# Patient Record
Sex: Male | Born: 1980 | Race: Black or African American | Hispanic: No | Marital: Married | Smoking: Former smoker
Health system: Southern US, Community
[De-identification: ages and names within clinical notes are randomized; demographics above are authoritative.]

## PROBLEM LIST (undated history)

## (undated) DIAGNOSIS — D649 Anemia, unspecified: Secondary | ICD-10-CM

## (undated) DIAGNOSIS — N10 Acute tubulo-interstitial nephritis: Secondary | ICD-10-CM

## (undated) DIAGNOSIS — F32A Depression, unspecified: Secondary | ICD-10-CM

## (undated) DIAGNOSIS — A419 Sepsis, unspecified organism: Secondary | ICD-10-CM

## (undated) DIAGNOSIS — F419 Anxiety disorder, unspecified: Secondary | ICD-10-CM

## (undated) DIAGNOSIS — I10 Essential (primary) hypertension: Secondary | ICD-10-CM

## (undated) DIAGNOSIS — Z22322 Carrier or suspected carrier of Methicillin resistant Staphylococcus aureus: Secondary | ICD-10-CM

## (undated) DIAGNOSIS — K219 Gastro-esophageal reflux disease without esophagitis: Secondary | ICD-10-CM

## (undated) HISTORY — DX: Depression, unspecified: F32.A

## (undated) HISTORY — DX: Carrier or suspected carrier of methicillin resistant Staphylococcus aureus: Z22.322

## (undated) HISTORY — DX: Anxiety disorder, unspecified: F41.9

---

## 2003-08-31 ENCOUNTER — Emergency Department (HOSPITAL_COMMUNITY): Admission: EM | Admit: 2003-08-31 | Discharge: 2003-08-31 | Payer: Self-pay | Admitting: Emergency Medicine

## 2003-08-31 ENCOUNTER — Emergency Department (HOSPITAL_COMMUNITY): Admission: EM | Admit: 2003-08-31 | Discharge: 2003-08-31 | Payer: Self-pay | Admitting: *Deleted

## 2003-09-04 ENCOUNTER — Emergency Department (HOSPITAL_COMMUNITY): Admission: EM | Admit: 2003-09-04 | Discharge: 2003-09-05 | Payer: Self-pay | Admitting: Emergency Medicine

## 2015-10-31 DIAGNOSIS — D649 Anemia, unspecified: Secondary | ICD-10-CM

## 2015-10-31 DIAGNOSIS — A419 Sepsis, unspecified organism: Secondary | ICD-10-CM

## 2015-10-31 HISTORY — DX: Sepsis, unspecified organism: A41.9

## 2015-10-31 HISTORY — DX: Anemia, unspecified: D64.9

## 2015-11-07 ENCOUNTER — Encounter (HOSPITAL_COMMUNITY)
Admission: EM | Disposition: A | Discharge status: Home / Self Care | Payer: Self-pay | Source: Home / Self Care | Attending: Internal Medicine

## 2015-11-07 ENCOUNTER — Encounter (HOSPITAL_COMMUNITY): Payer: Self-pay

## 2015-11-07 ENCOUNTER — Inpatient Hospital Stay (HOSPITAL_COMMUNITY): Payer: BC Managed Care – PPO | Admitting: Anesthesiology

## 2015-11-07 ENCOUNTER — Inpatient Hospital Stay (HOSPITAL_COMMUNITY): Payer: BC Managed Care – PPO

## 2015-11-07 ENCOUNTER — Emergency Department (HOSPITAL_COMMUNITY): Payer: BC Managed Care – PPO

## 2015-11-07 ENCOUNTER — Inpatient Hospital Stay (HOSPITAL_COMMUNITY)
Admission: EM | Admit: 2015-11-07 | Discharge: 2015-11-12 | DRG: 872 | Disposition: A | Discharge status: Home / Self Care | Payer: BC Managed Care – PPO | Attending: Internal Medicine | Admitting: Internal Medicine

## 2015-11-07 DIAGNOSIS — A419 Sepsis, unspecified organism: Secondary | ICD-10-CM | POA: Diagnosis present

## 2015-11-07 DIAGNOSIS — E876 Hypokalemia: Secondary | ICD-10-CM | POA: Insufficient documentation

## 2015-11-07 DIAGNOSIS — N132 Hydronephrosis with renal and ureteral calculous obstruction: Secondary | ICD-10-CM | POA: Diagnosis not present

## 2015-11-07 DIAGNOSIS — N201 Calculus of ureter: Secondary | ICD-10-CM | POA: Diagnosis not present

## 2015-11-07 DIAGNOSIS — L0291 Cutaneous abscess, unspecified: Secondary | ICD-10-CM

## 2015-11-07 DIAGNOSIS — E872 Acidosis, unspecified: Secondary | ICD-10-CM | POA: Insufficient documentation

## 2015-11-07 DIAGNOSIS — D649 Anemia, unspecified: Secondary | ICD-10-CM | POA: Diagnosis present

## 2015-11-07 DIAGNOSIS — R509 Fever, unspecified: Secondary | ICD-10-CM | POA: Diagnosis not present

## 2015-11-07 DIAGNOSIS — B9561 Methicillin susceptible Staphylococcus aureus infection as the cause of diseases classified elsewhere: Secondary | ICD-10-CM | POA: Diagnosis present

## 2015-11-07 DIAGNOSIS — I10 Essential (primary) hypertension: Secondary | ICD-10-CM | POA: Diagnosis present

## 2015-11-07 DIAGNOSIS — R000 Tachycardia, unspecified: Secondary | ICD-10-CM | POA: Diagnosis present

## 2015-11-07 DIAGNOSIS — Z87891 Personal history of nicotine dependence: Secondary | ICD-10-CM

## 2015-11-07 DIAGNOSIS — N202 Calculus of kidney with calculus of ureter: Secondary | ICD-10-CM | POA: Diagnosis present

## 2015-11-07 DIAGNOSIS — Z419 Encounter for procedure for purposes other than remedying health state, unspecified: Secondary | ICD-10-CM

## 2015-11-07 DIAGNOSIS — N10 Acute tubulo-interstitial nephritis: Secondary | ICD-10-CM | POA: Diagnosis present

## 2015-11-07 DIAGNOSIS — Z960 Presence of urogenital implants: Secondary | ICD-10-CM | POA: Diagnosis not present

## 2015-11-07 DIAGNOSIS — A4101 Sepsis due to Methicillin susceptible Staphylococcus aureus: Secondary | ICD-10-CM | POA: Diagnosis present

## 2015-11-07 DIAGNOSIS — R1031 Right lower quadrant pain: Secondary | ICD-10-CM | POA: Diagnosis present

## 2015-11-07 DIAGNOSIS — R7881 Bacteremia: Secondary | ICD-10-CM | POA: Diagnosis not present

## 2015-11-07 DIAGNOSIS — N139 Obstructive and reflux uropathy, unspecified: Secondary | ICD-10-CM | POA: Diagnosis present

## 2015-11-07 HISTORY — DX: Acute pyelonephritis: N10

## 2015-11-07 HISTORY — PX: CYSTOSCOPY W/ URETERAL STENT PLACEMENT: SHX1429

## 2015-11-07 LAB — URINALYSIS, ROUTINE W REFLEX MICROSCOPIC
Bilirubin Urine: NEGATIVE
GLUCOSE, UA: NEGATIVE mg/dL
KETONES UR: NEGATIVE mg/dL
NITRITE: NEGATIVE
PROTEIN: 30 mg/dL — AB
Specific Gravity, Urine: 1.026 (ref 1.005–1.030)
pH: 5.5 (ref 5.0–8.0)

## 2015-11-07 LAB — COMPREHENSIVE METABOLIC PANEL
ALK PHOS: 64 U/L (ref 38–126)
ALT: 21 U/L (ref 17–63)
AST: 28 U/L (ref 15–41)
Albumin: 4.3 g/dL (ref 3.5–5.0)
Anion gap: 10 (ref 5–15)
BILIRUBIN TOTAL: 0.8 mg/dL (ref 0.3–1.2)
BUN: 13 mg/dL (ref 6–20)
CALCIUM: 9.5 mg/dL (ref 8.9–10.3)
CHLORIDE: 106 mmol/L (ref 101–111)
CO2: 23 mmol/L (ref 22–32)
CREATININE: 0.79 mg/dL (ref 0.61–1.24)
Glucose, Bld: 141 mg/dL — ABNORMAL HIGH (ref 65–99)
Potassium: 3.5 mmol/L (ref 3.5–5.1)
Sodium: 139 mmol/L (ref 135–145)
TOTAL PROTEIN: 7.3 g/dL (ref 6.5–8.1)

## 2015-11-07 LAB — I-STAT CG4 LACTIC ACID, ED
LACTIC ACID, VENOUS: 1.24 mmol/L (ref 0.5–1.9)
Lactic Acid, Venous: 2.93 mmol/L (ref 0.5–1.9)

## 2015-11-07 LAB — CBC
HCT: 41.8 % (ref 39.0–52.0)
Hemoglobin: 14.8 g/dL (ref 13.0–17.0)
MCH: 32.3 pg (ref 26.0–34.0)
MCHC: 35.4 g/dL (ref 30.0–36.0)
MCV: 91.3 fL (ref 78.0–100.0)
PLATELETS: 307 10*3/uL (ref 150–400)
RBC: 4.58 MIL/uL (ref 4.22–5.81)
RDW: 12.2 % (ref 11.5–15.5)
WBC: 11.5 10*3/uL — AB (ref 4.0–10.5)

## 2015-11-07 LAB — LACTIC ACID, PLASMA: Lactic Acid, Venous: 1.2 mmol/L (ref 0.5–1.9)

## 2015-11-07 LAB — URINE MICROSCOPIC-ADD ON: Squamous Epithelial / LPF: NONE SEEN

## 2015-11-07 LAB — APTT: aPTT: 31 seconds (ref 24–37)

## 2015-11-07 LAB — LIPASE, BLOOD: LIPASE: 25 U/L (ref 11–51)

## 2015-11-07 LAB — PROCALCITONIN: Procalcitonin: 1.17 ng/mL

## 2015-11-07 LAB — PROTIME-INR
INR: 1.18 (ref 0.00–1.49)
PROTHROMBIN TIME: 15.2 s (ref 11.6–15.2)

## 2015-11-07 SURGERY — CYSTOSCOPY, WITH RETROGRADE PYELOGRAM AND URETERAL STENT INSERTION
Anesthesia: General | Site: Ureter | Laterality: Right

## 2015-11-07 MED ORDER — ROCURONIUM BROMIDE 100 MG/10ML IV SOLN
INTRAVENOUS | Status: DC | PRN
  Administered 2015-11-07: 20 mg via INTRAVENOUS

## 2015-11-07 MED ORDER — LIDOCAINE HCL 2 % EX GEL
CUTANEOUS | Status: AC
Start: 2015-11-07 — End: 2015-11-07
  Filled 2015-11-07: qty 20

## 2015-11-07 MED ORDER — SODIUM CHLORIDE 0.9% FLUSH
3.0000 mL | Freq: Two times a day (BID) | INTRAVENOUS | Status: DC
  Administered 2015-11-07 – 2015-11-11 (×7): 3 mL via INTRAVENOUS

## 2015-11-07 MED ORDER — MORPHINE SULFATE (PF) 4 MG/ML IV SOLN
4.0000 mg | Freq: Once | INTRAVENOUS | Status: AC
  Administered 2015-11-07: 4 mg via INTRAVENOUS
  Filled 2015-11-07: qty 1

## 2015-11-07 MED ORDER — IOPAMIDOL (ISOVUE-300) INJECTION 61%
INTRAVENOUS | Status: AC
  Administered 2015-11-07: 100 mL
  Filled 2015-11-07: qty 100

## 2015-11-07 MED ORDER — HYDROMORPHONE HCL 1 MG/ML IJ SOLN
1.0000 mg | INTRAMUSCULAR | Status: DC | PRN
  Administered 2015-11-08 (×2): 1 mg via INTRAVENOUS
  Filled 2015-11-07 (×2): qty 1

## 2015-11-07 MED ORDER — IBUPROFEN 100 MG/5ML PO SUSP
600.0000 mg | Freq: Once | ORAL | Status: DC
  Filled 2015-11-07: qty 30

## 2015-11-07 MED ORDER — IOPAMIDOL (ISOVUE-300) INJECTION 61%
INTRAVENOUS | Status: DC | PRN
  Administered 2015-11-07: .000001 mL via URETHRAL

## 2015-11-07 MED ORDER — PROPOFOL 10 MG/ML IV BOLUS
INTRAVENOUS | Status: DC | PRN
  Administered 2015-11-07: 200 mg via INTRAVENOUS

## 2015-11-07 MED ORDER — ACETAMINOPHEN 325 MG PO TABS
ORAL_TABLET | ORAL | Status: AC
  Filled 2015-11-07: qty 2

## 2015-11-07 MED ORDER — ONDANSETRON HCL 4 MG/2ML IJ SOLN
INTRAMUSCULAR | Status: AC
  Filled 2015-11-07: qty 2

## 2015-11-07 MED ORDER — IBUPROFEN 200 MG PO TABS
600.0000 mg | ORAL_TABLET | Freq: Once | ORAL | Status: AC
  Administered 2015-11-07: 600 mg via ORAL
  Filled 2015-11-07: qty 3

## 2015-11-07 MED ORDER — ONDANSETRON HCL 4 MG/2ML IJ SOLN: 4.0000 mg | Freq: Once | INTRAMUSCULAR | Status: DC | PRN

## 2015-11-07 MED ORDER — ACETAMINOPHEN 650 MG RE SUPP
650.0000 mg | Freq: Four times a day (QID) | RECTAL | Status: DC | PRN
  Administered 2015-11-08: 650 mg via RECTAL
  Filled 2015-11-07: qty 1

## 2015-11-07 MED ORDER — ONDANSETRON 4 MG PO TBDP
4.0000 mg | ORAL_TABLET | Freq: Once | ORAL | Status: AC
  Administered 2015-11-07: 4 mg via ORAL

## 2015-11-07 MED ORDER — SODIUM CHLORIDE 0.9 % IJ SOLN
INTRAMUSCULAR | Status: AC
  Filled 2015-11-07: qty 10

## 2015-11-07 MED ORDER — SODIUM CHLORIDE 0.9 % IV BOLUS (SEPSIS)
500.0000 mL | Freq: Once | INTRAVENOUS | Status: AC
  Administered 2015-11-07: 500 mL via INTRAVENOUS

## 2015-11-07 MED ORDER — SODIUM CHLORIDE 0.9 % IV BOLUS (SEPSIS)
1000.0000 mL | Freq: Once | INTRAVENOUS | Status: AC
  Administered 2015-11-07: 1000 mL via INTRAVENOUS

## 2015-11-07 MED ORDER — ACETAMINOPHEN 325 MG PO TABS
650.0000 mg | ORAL_TABLET | Freq: Once | ORAL | Status: AC
  Administered 2015-11-07: 650 mg via ORAL
  Filled 2015-11-07: qty 2

## 2015-11-07 MED ORDER — PIPERACILLIN-TAZOBACTAM 3.375 G IVPB
3.3750 g | Freq: Three times a day (TID) | INTRAVENOUS | Status: DC
Start: 2015-11-08 — End: 2015-11-08
  Administered 2015-11-08 (×2): 3.375 g via INTRAVENOUS
  Filled 2015-11-07 (×3): qty 50

## 2015-11-07 MED ORDER — ONDANSETRON HCL 4 MG PO TABS: 4.0000 mg | ORAL_TABLET | Freq: Four times a day (QID) | ORAL | Status: DC | PRN

## 2015-11-07 MED ORDER — IOPAMIDOL (ISOVUE-300) INJECTION 61%
INTRAVENOUS | Status: AC
  Filled 2015-11-07: qty 50

## 2015-11-07 MED ORDER — HYDROMORPHONE HCL 1 MG/ML IJ SOLN
1.0000 mg | Freq: Once | INTRAMUSCULAR | Status: AC
  Administered 2015-11-07: 1 mg via INTRAVENOUS
  Filled 2015-11-07: qty 1

## 2015-11-07 MED ORDER — ONDANSETRON HCL 4 MG/2ML IJ SOLN
4.0000 mg | Freq: Once | INTRAMUSCULAR | Status: AC
  Administered 2015-11-07: 4 mg via INTRAVENOUS
  Filled 2015-11-07: qty 2

## 2015-11-07 MED ORDER — SODIUM CHLORIDE 0.9 % IV SOLN
INTRAVENOUS | Status: DC
  Administered 2015-11-07 – 2015-11-08 (×2): via INTRAVENOUS
  Administered 2015-11-08: 1000 mL via INTRAVENOUS
  Administered 2015-11-08: 14:00:00 via INTRAVENOUS
  Administered 2015-11-09: 1000 mL via INTRAVENOUS
  Administered 2015-11-09 – 2015-11-12 (×8): via INTRAVENOUS

## 2015-11-07 MED ORDER — ONDANSETRON 4 MG PO TBDP
ORAL_TABLET | ORAL | Status: AC
  Filled 2015-11-07: qty 1

## 2015-11-07 MED ORDER — SODIUM CHLORIDE 0.9 % IR SOLN
Status: DC | PRN
  Administered 2015-11-07: 1000 mL

## 2015-11-07 MED ORDER — SUCCINYLCHOLINE CHLORIDE 20 MG/ML IJ SOLN
INTRAMUSCULAR | Status: DC | PRN
  Administered 2015-11-07: 140 mg via INTRAVENOUS

## 2015-11-07 MED ORDER — MIDAZOLAM HCL 2 MG/2ML IJ SOLN
INTRAMUSCULAR | Status: DC | PRN
  Administered 2015-11-07: 2 mg via INTRAVENOUS

## 2015-11-07 MED ORDER — PROPOFOL 10 MG/ML IV BOLUS
INTRAVENOUS | Status: AC
  Filled 2015-11-07: qty 40

## 2015-11-07 MED ORDER — ACETAMINOPHEN 10 MG/ML IV SOLN
INTRAVENOUS | Status: DC | PRN
  Administered 2015-11-07: 1000 mg via INTRAVENOUS

## 2015-11-07 MED ORDER — PIPERACILLIN-TAZOBACTAM 3.375 G IVPB 30 MIN
3.3750 g | Freq: Once | INTRAVENOUS | Status: AC
  Administered 2015-11-07: 3.375 g via INTRAVENOUS
  Filled 2015-11-07: qty 50

## 2015-11-07 MED ORDER — FENTANYL CITRATE (PF) 100 MCG/2ML IJ SOLN: 25.0000 ug | INTRAMUSCULAR | Status: DC | PRN

## 2015-11-07 MED ORDER — LACTATED RINGERS IV SOLN
INTRAVENOUS | Status: DC | PRN
  Administered 2015-11-07 (×2): via INTRAVENOUS

## 2015-11-07 MED ORDER — SUFENTANIL CITRATE 50 MCG/ML IV SOLN
INTRAVENOUS | Status: DC | PRN
  Administered 2015-11-07: 10 ug via INTRAVENOUS
  Administered 2015-11-07: 5 ug via INTRAVENOUS

## 2015-11-07 MED ORDER — SODIUM CHLORIDE 0.9 % IV BOLUS (SEPSIS): 1000.0000 mL | Freq: Once | INTRAVENOUS | Status: DC

## 2015-11-07 MED ORDER — SUGAMMADEX SODIUM 200 MG/2ML IV SOLN
INTRAVENOUS | Status: DC | PRN
  Administered 2015-11-07: 200 mg via INTRAVENOUS

## 2015-11-07 MED ORDER — ACETAMINOPHEN 325 MG PO TABS
650.0000 mg | ORAL_TABLET | Freq: Once | ORAL | Status: AC
  Administered 2015-11-07: 650 mg via ORAL

## 2015-11-07 MED ORDER — SUFENTANIL CITRATE 50 MCG/ML IV SOLN
INTRAVENOUS | Status: AC
  Filled 2015-11-07: qty 1

## 2015-11-07 MED ORDER — ONDANSETRON HCL 4 MG/2ML IJ SOLN
INTRAMUSCULAR | Status: DC | PRN
  Administered 2015-11-07: 4 mg via INTRAVENOUS

## 2015-11-07 MED ORDER — ACETAMINOPHEN 10 MG/ML IV SOLN
INTRAVENOUS | Status: AC
  Filled 2015-11-07: qty 100

## 2015-11-07 MED ORDER — MIDAZOLAM HCL 2 MG/2ML IJ SOLN
INTRAMUSCULAR | Status: AC
  Filled 2015-11-07: qty 2

## 2015-11-07 MED ORDER — LIDOCAINE HCL (CARDIAC) 20 MG/ML IV SOLN
INTRAVENOUS | Status: DC | PRN
  Administered 2015-11-07: 60 mg via INTRATRACHEAL

## 2015-11-07 MED ORDER — LIDOCAINE 2% (20 MG/ML) 5 ML SYRINGE
INTRAMUSCULAR | Status: AC
  Filled 2015-11-07: qty 5

## 2015-11-07 MED ORDER — SUGAMMADEX SODIUM 200 MG/2ML IV SOLN
INTRAVENOUS | Status: AC
  Filled 2015-11-07: qty 2

## 2015-11-07 MED ORDER — ONDANSETRON HCL 4 MG/2ML IJ SOLN
4.0000 mg | Freq: Four times a day (QID) | INTRAMUSCULAR | Status: DC | PRN
  Administered 2015-11-08 – 2015-11-11 (×6): 4 mg via INTRAVENOUS
  Filled 2015-11-07 (×6): qty 2

## 2015-11-07 MED ORDER — ACETAMINOPHEN 325 MG PO TABS
650.0000 mg | ORAL_TABLET | Freq: Four times a day (QID) | ORAL | Status: DC | PRN
  Administered 2015-11-08 – 2015-11-10 (×6): 650 mg via ORAL
  Filled 2015-11-07 (×6): qty 2

## 2015-11-07 MED ORDER — IBUPROFEN 100 MG/5ML PO SUSP
ORAL | Status: AC
  Filled 2015-11-07: qty 30

## 2015-11-07 MED ORDER — SUCCINYLCHOLINE CHLORIDE 200 MG/10ML IV SOSY
PREFILLED_SYRINGE | INTRAVENOUS | Status: AC
  Filled 2015-11-07: qty 10

## 2015-11-07 MED ORDER — IBUPROFEN 100 MG/5ML PO SUSP
ORAL | Status: AC
  Filled 2015-11-07: qty 20

## 2015-11-07 MED ORDER — ROCURONIUM BROMIDE 50 MG/5ML IV SOLN
INTRAVENOUS | Status: AC
  Filled 2015-11-07: qty 1

## 2015-11-07 SURGICAL SUPPLY — 32 items
ADAPTER CATH URET PLST 4-6FR (CATHETERS) ×3 IMPLANT
BAG URINE DRAINAGE (UROLOGICAL SUPPLIES) ×3 IMPLANT
BAG URO CATCHER STRL LF (MISCELLANEOUS) ×3 IMPLANT
BENZOIN TINCTURE PRP APPL 2/3 (GAUZE/BANDAGES/DRESSINGS) IMPLANT
BLADE 10 SAFETY STRL DISP (BLADE) ×3 IMPLANT
BUCKET BIOHAZARD WASTE 5 GAL (MISCELLANEOUS) ×3 IMPLANT
CATH FOLEY 2WAY SLVR  5CC 16FR (CATHETERS)
CATH FOLEY 2WAY SLVR 5CC 16FR (CATHETERS) IMPLANT
CATH INTERMIT  6FR 70CM (CATHETERS) ×3 IMPLANT
CATH URET 5FR 28IN CONE TIP (BALLOONS)
CATH URET 5FR 70CM CONE TIP (BALLOONS) IMPLANT
COVER SURGICAL LIGHT HANDLE (MISCELLANEOUS) IMPLANT
Contour soft percuflex material Ureteral Stent ×3 IMPLANT
DRAPE CAMERA CLOSED 9X96 (DRAPES) ×3 IMPLANT
GLOVE BIO SURGEON STRL SZ7.5 (GLOVE) ×3 IMPLANT
GOWN STRL REUS W/ TWL XL LVL3 (GOWN DISPOSABLE) ×2 IMPLANT
GOWN STRL REUS W/TWL XL LVL3 (GOWN DISPOSABLE) ×4
GUIDEWIRE ANG ZIPWIRE 038X150 (WIRE) IMPLANT
GUIDEWIRE COOK  .035 (WIRE) IMPLANT
GUIDEWIRE STR DUAL SENSOR (WIRE) ×3 IMPLANT
KIT ROOM TURNOVER OR (KITS) ×3 IMPLANT
NS IRRIG 1000ML POUR BTL (IV SOLUTION) ×3 IMPLANT
PACK CYSTOSCOPY (CUSTOM PROCEDURE TRAY) ×3 IMPLANT
PAD ARMBOARD 7.5X6 YLW CONV (MISCELLANEOUS) ×6 IMPLANT
PLUG CATH AND CAP STER (CATHETERS) IMPLANT
STENT CONTOUR 6FRX26X.035 (STENTS) ×6 IMPLANT
STENT URET 6FRX24 CONTOUR (STENTS) IMPLANT
SYRINGE CONTROL L 12CC (SYRINGE) ×3 IMPLANT
SYRINGE TOOMEY DISP (SYRINGE) IMPLANT
UNDERPAD 30X30 INCONTINENT (UNDERPADS AND DIAPERS) ×3 IMPLANT
WATER STERILE IRR 1000ML POUR (IV SOLUTION) ×3 IMPLANT
WIRE COONS/BENSON .038X145CM (WIRE) IMPLANT

## 2015-11-07 NOTE — Progress Notes (Signed)
Pharmacy Antibiotic Note  James Marshall is a 35 y.o. male admitted on 11/07/2015 with fever, vomiting, and chills.  Pharmacy has been consulted for zosyn dosing for probable intra abdominal infection. Renal function stable.  Plan: 1) Zosyn 3.375g IV q8 (4 hour infusion) 2) Follow renal function, cultures, LOT  Height: 5\' 10"  (177.8 cm) Weight: 175 lb (79.379 kg) IBW/kg (Calculated) : 73  Temp (24hrs), Avg:102 F (38.9 C), Min:101 F (38.3 C), Max:103 F (39.4 C)   Recent Labs Lab 11/07/15 1315 11/07/15 1323 11/07/15 1638  WBC 11.5*  --   --   CREATININE 0.79  --   --   LATICACIDVEN  --  1.24 2.93*    Estimated Creatinine Clearance: 131.8 mL/min (by C-G formula based on Cr of 0.79).    No Known Allergies  Antimicrobials this admission: 7/8 Zosyn >>  Dose adjustments this admission: n/a  Microbiology results: n/a  Thank you for allowing pharmacy to be a part of this patient's care.  Fredrik RiggerMarkle, Layan Zalenski Sue 11/07/2015 4:58 PM

## 2015-11-07 NOTE — ED Notes (Signed)
Brought back into triage for vomiting and chiils, vitals rechecked and zofran administered

## 2015-11-07 NOTE — Anesthesia Postprocedure Evaluation (Signed)
Anesthesia Post Note  Patient: James Marshall  Procedure(s) Performed: Procedure(s) (LRB): CYSTOSCOPY WITH RETROGRADE AND URETERAL STENT PLACEMENT (Right)  Patient location during evaluation: PACU Anesthesia Type: General Level of consciousness: awake, awake and alert and oriented Pain management: pain level controlled Vital Signs Assessment: post-procedure vital signs reviewed and stable Respiratory status: spontaneous breathing, nonlabored ventilation and respiratory function stable Cardiovascular status: blood pressure returned to baseline Anesthetic complications: no    Last Vitals:  Filed Vitals:   11/07/15 2223 11/07/15 2230  BP: 121/75 112/73  Pulse:  89  Temp: 36.9 C   Resp:  24    Last Pain:  Filed Vitals:   11/07/15 2236  PainSc: 7                  Cherril Hett COKER

## 2015-11-07 NOTE — ED Notes (Signed)
MD at bedside. 

## 2015-11-07 NOTE — H&P (Signed)
History and Physical    James Marshall:[id number] DOB: 1981/03/04 DOA: 11/07/2015  PCP: No primary care provider on file. The patient says that he does not have a PCP.  Patient coming from: Home.  Chief Complaint: RLQ abodminal pain with fever, nausea, and vomiting  HPI: James Marshall is a 35 y.o. gentleman with a history of prior pyelonephritis who presents to the ED tonight accompanied by multiple family members.  He describes the acute onset of RLQ pain around 6pm last night.  No specific triggers.  He subsequently developed fever, sweats, shaking chills, and nonbloody emesis.  He has noticed decreased urine output.  Urine is concentrated but he denies dysuria.  No known history of renal stones.  No syncope/LOC.  Advil did not alleviate his pain.  He presented to the ED for further evaluation and treatment.  ED Course: The patient is febrile to 103 with tachycardia.  He has a leukocytosis.  CT A/P shows an obstructing right sided ureteral calculus with hydronephrosis and changes in the right kidney parynchema that likely reflect acute pyelonephritis.  No abscess scene at this time.  CODE Sepsis initiated.  Blood and urine cultures are pending.  IV Zosyn initiated.  The patient received weight based NS fluid bolus per protocol.  Lactic acid level is elevated.  The patient is going to the OR emergently with Dr. Annabell Howells of Urology for ureteral stent.  Hospitalist asked to admit.  Review of Systems: As per HPI otherwise 10 point review of systems negative.    Past Medical History  Diagnosis Date  . Acute pyelonephritis     History reviewed. No pertinent past surgical history.  The patient denies any prior surgeries.   reports that he has quit smoking. He does not have any smokeless tobacco history on file. He reports that he drinks alcohol. He reports that he does not use illicit drugs.  The patient reports that currently "VAPES".  Stopped smoking cigarettes 2 years ago.  17 pack year  history.  Drinks EtOH 2-3 times per week; denies history of EtOH withdrawal.  No illicit drug use.  He is single.  He has an 29 yo daughter.  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  Allergies  Allergen Reactions  . Hydrocodone Nausea And Vomiting    Family History  Problem Relation Age of Onset  . Cervical cancer Mother   . Diabetes Paternal Grandfather   . Prostate cancer Paternal Grandfather   . Heart disease Maternal Grandfather      Prior to Admission medications   Not on File  NONE  Physical Exam: Filed Vitals:   11/07/15 1915 11/07/15 1921 11/07/15 1930 11/07/15 1945  BP: 133/81  142/83 155/89  Pulse: 103  115 112  Temp:  102.6 F (39.2 C)    TempSrc:  Oral    Resp: 18  18 24   Height:      Weight:      SpO2: 92%  99% 97%      Constitutional: Ill appearing, active chills, but he is not decompensating at this point  Filed Vitals:   11/07/15 1915 11/07/15 1921 11/07/15 1930 11/07/15 1945  BP: 133/81  142/83 155/89  Pulse: 103  115 112  Temp:  102.6 F (39.2 C)    TempSrc:  Oral    Resp: 18  18 24   Height:      Weight:      SpO2: 92%  99% 97%   Eyes: PERRL, lids and  conjunctivae normal ENMT: Mucous membranes are moist. Posterior pharynx clear of any exudate or lesions. Normal dentition.  Neck: normal appearance, supple, no masses Respiratory: clear to auscultation bilaterally, no wheezing, no crackles. Normal respiratory effort. No accessory muscle use.  Cardiovascular: Normal rate, regular rhythm, no murmurs / rubs / gallops. No extremity edema. 2+ pedal pulses. GI: abdomen is mildly distended but compressible.  Mild tenderness without significant guarding.  Bowel sounds are hypoactive.   Musculoskeletal:  No joint deformity in upper and lower extremities. Good ROM, no contractures. Normal muscle tone.  Skin: no rashes, warm and dry.  He is not clammy. Neurologic: CN 2-12 grossly intact. Sensation intact, Strength symmetric  bilaterally, 5/5  Psychiatric: Normal judgment and insight. Alert and oriented x 3. Normal mood.     Labs on Admission: I have personally reviewed following labs and imaging studies  CBC:  Recent Labs Lab 11/07/15 1315  WBC 11.5*  HGB 14.8  HCT 41.8  MCV 91.3  PLT 307   Basic Metabolic Panel:  Recent Labs Lab 11/07/15 1315  NA 139  K 3.5  CL 106  CO2 23  GLUCOSE 141*  BUN 13  CREATININE 0.79  CALCIUM 9.5   GFR: Estimated Creatinine Clearance: 131.8 mL/min (by C-G formula based on Cr of 0.79). Liver Function Tests:  Recent Labs Lab 11/07/15 1315  AST 28  ALT 21  ALKPHOS 64  BILITOT 0.8  PROT 7.3  ALBUMIN 4.3    Recent Labs Lab 11/07/15 1315  LIPASE 25   Urine analysis:    Component Value Date/Time   COLORURINE YELLOW 11/07/2015 1314   APPEARANCEUR CLOUDY* 11/07/2015 1314   LABSPEC 1.026 11/07/2015 1314   PHURINE 5.5 11/07/2015 1314   GLUCOSEU NEGATIVE 11/07/2015 1314   HGBUR MODERATE* 11/07/2015 1314   BILIRUBINUR NEGATIVE 11/07/2015 1314   KETONESUR NEGATIVE 11/07/2015 1314   PROTEINUR 30* 11/07/2015 1314   NITRITE NEGATIVE 11/07/2015 1314   LEUKOCYTESUR MODERATE* 11/07/2015 1314   Sepsis Labs:  Lactic acid level 1.24 --> 2.93  Radiological Exams on Admission: Ct Abdomen Pelvis W Contrast  11/07/2015  CLINICAL DATA:  Right lower quadrant pain for 2 days. Vomiting and diarrhea. EXAM: CT ABDOMEN AND PELVIS WITH CONTRAST TECHNIQUE: Multidetector CT imaging of the abdomen and pelvis was performed using the standard protocol following bolus administration of intravenous contrast. CONTRAST:  1 ISOVUE-300 IOPAMIDOL (ISOVUE-300) INJECTION 61% COMPARISON:  Multiple CT scans since Sep 01, 2003 FINDINGS: A few thin walled lung cysts are seen in the right lung base with no solid components. No suspicious nodules or masses. The lung bases are otherwise normal. No free air or free fluid. Bilateral renal stones are identified with the largest seen on the left  measuring 6.7 mm on series 5, image 91. There is very mild stranding adjacent to the right kidney in addition to moderate hydronephrosis, not seen previously. The right ureter is prominent in caliber along its length with a 3.5 mm stone in the distal right ureter just above the UVJ. There is mild thickening of the ureteral wall at the level of the right distal ureteral stone, consistent with inflammation. Additionally, there is an ill-defined region of low attenuation in the anterior right kidney as seen on series 2, image 31 with no definitive focal adjacent fat stranding. This low-attenuation measures 2.6 by 1.4 cm. A few tiny low-attenuation lesions in both kidneys are likely cysts but too small to characterize. No other suspicious renal abnormalities. There is hepatic steatosis. The liver, gallbladder, portal  vein, spleen, adrenal glands, and pancreas are within normal limits. The abdominal aorta is normal in appearance. No adenopathy. The stomach and small bowel are normal. The colon and visualized appendix are normal. The pelvis demonstrates a distal right ureteral stone as described above. No adenopathy or mass identified the pelvis. The prostate, seminal vesicles, and bladder are normal in appearance. The visualized bones are unremarkable. Delayed images demonstrate no filling defects in the upper renal collecting systems bilaterally. There is appropriate excretion into the right renal collecting system. IMPRESSION: 1. Obstructing stone in the distal right ureter measuring 3.5 mm with moderate hydronephrosis and mild perinephric stranding. The region of the ill defined low-attenuation in the anterior right kidney could represent developing focal pyelonephritis. No discrete abscess seen on this study. Recommend clinical correlation and follow-up. 2. Multiple other bilateral renal stones. Electronically Signed   By: Gerome Samavid  Williams III M.D   On: 11/07/2015 18:38    EKG:  Pending.  Assessment/Plan Principal Problem:   Sepsis (HCC) Active Problems:   Right ureteral stone   Obstructive uropathy   Lactic acidosis   Acute pyelonephritis   Fever   Sinus tachycardia (HCC)      Sepsis without shock secondary to acute pyelonephritis and infected, obstructing right ureteral stone.  No evidence of AKI at this time.  Going to OR tonight for right ureteral stent. --agree with management per sepsis protocol, Empiric IV zosyn for now. --IV fluid resuscitation, weight based bolus per protocol given in the ED, then NS at 125cc/hr --Blood and urine cultures pending --Repeat lactic acid x 2 to establish trend/look for resolutione --Check procalcitonin --EKG, chest xray, coags per sepsis protocol, all pending --Expect tachycardia to resolve with proper hydration and management of infection --Urology assistance greatly appreciated   DVT prophylaxis: SCDs for now, he is high risk for bleeding after urological intervention, re-evaluate if stable in 24 hours Code Status: FULL Family Communication: Father, sister, brother present in the ED at time of admission Disposition Plan: Expect he will be here at least two midnights Consults called: Urology, Dr. Annabell HowellsWrenn Admission status: Inpatient, Stepdown   TIME SPENT: 60 minutes   Jerene Bearsarter,Bellanie Matthew Harrison MD [facility] Pager [phone]  If 7PM-7AM, please contact night-coverage [email or web address] Password [email or web address]  11/07/2015, 8:14 PM

## 2015-11-07 NOTE — Discharge Instructions (Signed)

## 2015-11-07 NOTE — Transfer of Care (Signed)
Immediate Anesthesia Transfer of Care Note  Patient: James Marshall  Procedure(s) Performed: Procedure(s): CYSTOSCOPY WITH RETROGRADE AND URETERAL STENT PLACEMENT (Right)  Patient Location: PACU  Anesthesia Type:General  Level of Consciousness: awake, alert  and oriented  Airway & Oxygen Therapy: Patient Spontanous Breathing and Patient connected to face mask oxygen  Post-op Assessment: Report given to RN and Post -op Vital signs reviewed and stable  Post vital signs: Reviewed and stable  Last Vitals:  Filed Vitals:   11/07/15 1945 11/07/15 2125  BP: 155/89 116/69  Pulse: 112 110  Temp:  39.2 C  Resp: 24 20    Last Pain:  Filed Vitals:   11/07/15 2129  PainSc: 7          Complications: No apparent anesthesia complications

## 2015-11-07 NOTE — Brief Op Note (Signed)
11/07/2015  9:09 PM  PATIENT:  Kalin B Statz  35 y.o. male  PRE-OPERATIVE DIAGNOSIS:  retained ureteral stone  POST-OPERATIVE DIAGNOSIS:  retained ureteral stone  PROCEDURE:  Procedure(s): CYSTOSCOPY WITH RETROGRADE AND URETERAL STENT PLACEMENT (Right)  SURGEON:  Surgeon(s) and Role:    * Bjorn PippinJohn Keithen Capo, MD - Primary  PHYSICIAN ASSISTANT:   ASSISTANTS: none   ANESTHESIA:   general  EBL:  Total I/O In: 1500 [I.V.:1500] Out: -   BLOOD ADMINISTERED:none  DRAINS: 6 x 26 JJ stent , right.   LOCAL MEDICATIONS USED:  NONE  SPECIMEN:  No Specimen  DISPOSITION OF SPECIMEN:  N/A  COUNTS:  YES  TOURNIQUET:  * No tourniquets in log *  DICTATION: .Other Dictation: Dictation Number [id number]  PLAN OF CARE: Admit for overnight observation  PATIENT DISPOSITION:  PACU - hemodynamically stable.   Delay start of Pharmacological VTE agent (>24hrs) due to surgical blood loss or risk of bleeding: not applicable

## 2015-11-07 NOTE — Anesthesia Procedure Notes (Signed)
Procedure Name: Intubation Date/Time: 11/07/2015 8:50 PM Performed by: Javius Sylla S Pre-anesthesia Checklist: Patient identified, Emergency Drugs available, Suction available, Patient being monitored and Timeout performed Patient Re-evaluated:Patient Re-evaluated prior to inductionOxygen Delivery Method: Circle system utilized Preoxygenation: Pre-oxygenation with 100% oxygen Intubation Type: IV induction, Rapid sequence and Cricoid Pressure applied Ventilation: Mask ventilation without difficulty Laryngoscope Size: Mac and 4 Grade View: Grade II Tube type: Oral Tube size: 8.0 mm Number of attempts: 1 Airway Equipment and Method: Stylet Secured at: 22 cm Tube secured with: Tape Dental Injury: Teeth and Oropharynx as per pre-operative assessment

## 2015-11-07 NOTE — ED Provider Notes (Signed)
CSN: [id number]     Arrival date & time 11/07/15  1303 History   First MD Initiated Contact with Patient 11/07/15 1600     Chief Complaint  Patient presents with  . RLQ pain      (Consider location/radiation/quality/duration/timing/severity/associated sxs/prior Treatment) HPI  35 year old male presents with right-sided abdominal pain. Started yesterday and then seemed to go away but then came back around 4 AM. Pain is sharp in his right lower quadrant. Has had multiple episodes of vomiting, which he describes as brown in color. One loose stool yesterday but otherwise no diarrhea. His urine has been a little dark but he denies dysuria or hematuria. Pain is constant. No prior abdominal surgeries. Patient was given Tylenol in the waiting room but he vomited it back up. He states his nausea is a little bit better after ODT Zofran.  History reviewed. No pertinent past medical history. History reviewed. No pertinent past surgical history. No family history on file. Social History  Substance Use Topics  . Smoking status: Never Smoker   . Smokeless tobacco: None  . Alcohol Use: Yes    Review of Systems  Constitutional: Positive for fever.  Gastrointestinal: Positive for nausea, vomiting, abdominal pain and diarrhea (one loose stool).  Genitourinary: Negative for dysuria and hematuria.  All other systems reviewed and are negative.     Allergies  Review of patient's allergies indicates no known allergies.  Home Medications   Prior to Admission medications   Not on File   BP 122/81 mmHg  Pulse 112  Temp(Src) 101.9 F (38.8 C) (Oral)  Resp 20  Ht 5\' 10"  (1.778 m)  Wt 175 lb (79.379 kg)  BMI 25.11 kg/m2  SpO2 99% Physical Exam  Constitutional: He is oriented to person, place, and time. He appears well-developed and well-nourished.  HENT:  Head: Normocephalic and atraumatic.  Right Ear: External ear normal.  Left Ear: External ear normal.  Nose: Nose normal.  Eyes: Right eye  exhibits no discharge. Left eye exhibits no discharge.  Neck: Neck supple.  Cardiovascular: Normal rate, regular rhythm, normal heart sounds and intact distal pulses.   Pulmonary/Chest: Effort normal and breath sounds normal.  Abdominal: Soft. There is tenderness (RLQ > RUQ pain) in the right upper quadrant and right lower quadrant. There is no rigidity, no rebound and no guarding.  Neurological: He is alert and oriented to person, place, and time.  Skin: Skin is warm and dry.  Nursing note and vitals reviewed.   ED Course  Procedures (including critical care time) Labs Review Labs Reviewed  COMPREHENSIVE METABOLIC PANEL - Abnormal; Notable for the following:    Glucose, Bld 141 (*)    All other components within normal limits  CBC - Abnormal; Notable for the following:    WBC 11.5 (*)    All other components within normal limits  URINALYSIS, ROUTINE W REFLEX MICROSCOPIC (NOT AT [facility]) - Abnormal; Notable for the following:    APPearance CLOUDY (*)    Hgb urine dipstick MODERATE (*)    Protein, ur 30 (*)    Leukocytes, UA MODERATE (*)    All other components within normal limits  URINE MICROSCOPIC-ADD ON - Abnormal; Notable for the following:    Bacteria, UA FEW (*)    All other components within normal limits  I-STAT CG4 LACTIC ACID, ED - Abnormal; Notable for the following:    Lactic Acid, Venous 2.93 (*)    All other components within normal limits  CULTURE, BLOOD (  ROUTINE X 2)  CULTURE, BLOOD (ROUTINE X 2)  URINE CULTURE  MRSA PCR SCREENING  LIPASE, BLOOD  LACTIC ACID, PLASMA  PROCALCITONIN  PROTIME-INR  APTT  LACTIC ACID, PLASMA  CBC  BASIC METABOLIC PANEL  I-STAT CG4 LACTIC ACID, ED    Imaging Review Ct Abdomen Pelvis W Contrast  11/07/2015  CLINICAL DATA:  Right lower quadrant pain for 2 days. Vomiting and diarrhea. EXAM: CT ABDOMEN AND PELVIS WITH CONTRAST TECHNIQUE: Multidetector CT imaging of the abdomen and pelvis was performed using the standard protocol  following bolus administration of intravenous contrast. CONTRAST:  1 ISOVUE-300 IOPAMIDOL (ISOVUE-300) INJECTION 61% COMPARISON:  Multiple CT scans since Sep 01, 2003 FINDINGS: A few thin walled lung cysts are seen in the right lung base with no solid components. No suspicious nodules or masses. The lung bases are otherwise normal. No free air or free fluid. Bilateral renal stones are identified with the largest seen on the left measuring 6.7 mm on series 5, image 91. There is very mild stranding adjacent to the right kidney in addition to moderate hydronephrosis, not seen previously. The right ureter is prominent in caliber along its length with a 3.5 mm stone in the distal right ureter just above the UVJ. There is mild thickening of the ureteral wall at the level of the right distal ureteral stone, consistent with inflammation. Additionally, there is an ill-defined region of low attenuation in the anterior right kidney as seen on series 2, image 31 with no definitive focal adjacent fat stranding. This low-attenuation measures 2.6 by 1.4 cm. A few tiny low-attenuation lesions in both kidneys are likely cysts but too small to characterize. No other suspicious renal abnormalities. There is hepatic steatosis. The liver, gallbladder, portal vein, spleen, adrenal glands, and pancreas are within normal limits. The abdominal aorta is normal in appearance. No adenopathy. The stomach and small bowel are normal. The colon and visualized appendix are normal. The pelvis demonstrates a distal right ureteral stone as described above. No adenopathy or mass identified the pelvis. The prostate, seminal vesicles, and bladder are normal in appearance. The visualized bones are unremarkable. Delayed images demonstrate no filling defects in the upper renal collecting systems bilaterally. There is appropriate excretion into the right renal collecting system. IMPRESSION: 1. Obstructing stone in the distal right ureter measuring 3.5 mm with  moderate hydronephrosis and mild perinephric stranding. The region of the ill defined low-attenuation in the anterior right kidney could represent developing focal pyelonephritis. No discrete abscess seen on this study. Recommend clinical correlation and follow-up. 2. Multiple other bilateral renal stones. Electronically Signed   By: Gerome Sam III M.D   On: 11/07/2015 18:38   Dg Cystogram  11/07/2015  CLINICAL DATA:  Acute pyelonephritis EXAM: CYSTOGRAM TECHNIQUE: Cystoscopy was performed by the urologist. Spot intraoperative fluoroscopic images were provided. FLUOROSCOPY TIME:  9 seconds COMPARISON:  CT abdomen pelvis dated 11/07/2015 FINDINGS: Frontal and lateral spot fluoroscopic images have been provided during the procedure. Contrast is present in the bladder. A right ureteral stent is incompletely visualized. IMPRESSION: Intraoperative fluoroscopic images during cystoscopy, as described above. Electronically Signed   By: Charline Bills M.D.   On: 11/07/2015 21:35   Dg Chest Port 1 View  11/07/2015  CLINICAL DATA:  Acute onset of sepsis.  Initial encounter. EXAM: PORTABLE CHEST 1 VIEW COMPARISON:  Thoracic spine radiographs performed 03/21/2009 FINDINGS: The lungs are well-aerated. Mild vascular congestion is suggested. There is no evidence of focal opacification, pleural effusion or pneumothorax. The cardiomediastinal  silhouette is borderline normal in size. No acute osseous abnormalities are seen. IMPRESSION: Mild vascular congestion suggested.  Lungs remain grossly clear. Electronically Signed   By: Roanna Raider M.D.   On: 11/07/2015 22:47   I have personally reviewed and evaluated these images and lab results as part of my medical decision-making.   EKG Interpretation None      CRITICAL CARE Performed by: Pricilla Loveless T   Total critical care time: 35 minutes  Critical care time was exclusive of separately billable procedures and treating other patients.  Critical care was  necessary to treat or prevent imminent or life-threatening deterioration.  Critical care was time spent personally by me on the following activities: development of treatment plan with patient and/or surrogate as well as nursing, discussions with consultants, evaluation of patient's response to treatment, examination of patient, obtaining history from patient or surrogate, ordering and performing treatments and interventions, ordering and review of laboratory studies, ordering and review of radiographic studies, pulse oximetry and re-evaluation of patient's condition.  MDM   Final diagnoses:  Sepsis (HCC)  Ureteral Stone  Patient's workup shows that his fever and pain are coming from a right ureteral stone that appears to be infected. Nausea was controlled and he was given IV morphine and Dilaudid. Appendix appears normal. Discussed with urology, Dr. Annabell Howells, who will, take patient to the OR for cystoscopy and stent placement. Hospitalist, Dr. [name], consulted for admission. Patient does have signs/symptoms of sepsis and his initial lactate was negative but second lactate has increased to over 2. However he is not hypotensive. Continue to IV hydrate and give IV antibiotics.    Pricilla Loveless, MD 11/07/15 [phone]

## 2015-11-07 NOTE — Anesthesia Preprocedure Evaluation (Addendum)
Anesthesia Evaluation  Patient identified by MRN, date of birth, ID band Patient awake    Reviewed: Allergy & Precautions, NPO status , Patient's Chart, lab work & pertinent test results  Airway Mallampati: II  TM Distance: >3 FB Neck ROM: Full    Dental  (+) Teeth Intact, Dental Advisory Given   Pulmonary former smoker,    breath sounds clear to auscultation       Cardiovascular  Rhythm:Regular Rate:Normal     Neuro/Psych    GI/Hepatic   Endo/Other    Renal/GU      Musculoskeletal   Abdominal   Peds  Hematology   Anesthesia Other Findings   Reproductive/Obstetrics                            Anesthesia Physical Anesthesia Plan  ASA: III and emergent  Anesthesia Plan: General   Post-op Pain Management:    Induction: Intravenous  Airway Management Planned: Oral ETT  Additional Equipment:   Intra-op Plan:   Post-operative Plan: Extubation in OR  Informed Consent: I have reviewed the patients History and Physical, chart, labs and discussed the procedure including the risks, benefits and alternatives for the proposed anesthesia with the patient or authorized representative who has indicated his/her understanding and acceptance.   Dental advisory given  Plan Discussed with: CRNA and Anesthesiologist  Anesthesia Plan Comments:        Anesthesia Quick Evaluation

## 2015-11-07 NOTE — H&P (Signed)
Subjective: CC: Right flank pain.  Hx: James Marshall is a 35 yo male who I asked to see in consultation by Dr. Criss Alvine for a 3.23mm right distal stone with sepsis.   He had the onset yesterday at 6pm of right flank pain and dark urine.  The pain was severe with nausea and vomiting.   He has an elevated lactate in the ER today.   His WBC is 11.5K and his urine is dirty.   He has had a prior kidney infection and it looks like it was in 2005.   He has no other GU history.   ROS:  Review of Systems  Constitutional: Positive for fever and chills.  Gastrointestinal: Positive for nausea, vomiting and abdominal pain.  Genitourinary: Positive for dysuria and flank pain.  All other systems reviewed and are negative.   No Known Allergies  History reviewed. No pertinent past medical history.  History reviewed. No pertinent past surgical history.  Social History   Social History  . Marital Status: Single    Spouse Name: N/A  . Number of Children: N/A  . Years of Education: N/A   Occupational History  . Not on file.   Social History Main Topics  . Smoking status: Never Smoker   . Smokeless tobacco: Not on file  . Alcohol Use: Yes  . Drug Use: Not on file  . Sexual Activity: Not on file   Other Topics Concern  . Not on file   Social History Narrative  . No narrative on file    No family history on file.  Anti-infectives: Anti-infectives    Start     Dose/Rate Route Frequency Ordered Stop   11/08/15 0100  piperacillin-tazobactam (ZOSYN) IVPB 3.375 g     3.375 g 12.5 mL/hr over 240 Minutes Intravenous Every 8 hours 11/07/15 1657     11/07/15 1700  piperacillin-tazobactam (ZOSYN) IVPB 3.375 g     3.375 g 100 mL/hr over 30 Minutes Intravenous  Once 11/07/15 1646 11/07/15 1757      Current Facility-Administered Medications  Medication Dose Route Frequency Provider Last Rate Last Dose  . [START ON 11/08/2015] piperacillin-tazobactam (ZOSYN) IVPB 3.375 g  3.375 g Intravenous Q8H  Emi Holes, [facility]       No current outpatient prescriptions on file.     Objective: Vital signs in last 24 hours: Temp:  [101 F (38.3 C)-103 F (39.4 C)] 102.6 F (39.2 C) (07/08 1921) Pulse Rate:  [97-114] 103 (07/08 1845) Resp:  [14-22] 19 (07/08 1845) BP: (112-132)/(68-82) 126/76 mmHg (07/08 1845) SpO2:  [92 %-99 %] 95 % (07/08 1845) Weight:  [79.379 kg (175 lb)] 79.379 kg (175 lb) (07/08 1308)  Intake/Output from previous day:   Intake/Output this shift: Total I/O In: 1500 [I.V.:1500] Out: -    Physical Exam  Constitutional: He is oriented to person, place, and time and well-developed, well-nourished, and in no distress.  Having a rigor  HENT:  Head: Normocephalic and atraumatic.  Neck: Normal range of motion. Neck supple. No thyromegaly present.  Cardiovascular:  Sinus tach with no murmur.  Pulmonary/Chest: Effort normal and breath sounds normal. No respiratory distress.  Abdominal: Soft. He exhibits no mass. There is tenderness (RUQ and RCVA). There is guarding.  Musculoskeletal: Normal range of motion. He exhibits no edema or tenderness.  Lymphadenopathy:    He has no cervical adenopathy.  Neurological: He is alert and oriented to person, place, and time.  Skin:  Hot and flushed.  Psychiatric: Mood and  affect normal.  Vitals reviewed.   Lab Results:   Recent Labs  11/07/15 1315  WBC 11.5*  HGB 14.8  HCT 41.8  PLT 307   BMET  Recent Labs  11/07/15 1315  NA 139  K 3.5  CL 106  CO2 23  GLUCOSE 141*  BUN 13  CREATININE 0.79  CALCIUM 9.5   PT/INR No results for input(s): LABPROT, INR in the last 72 hours. ABG No results for input(s): PHART, HCO3 in the last 72 hours.  Invalid input(s): PCO2, PO2  Studies/Results: Ct Abdomen Pelvis W Contrast  11/07/2015  CLINICAL DATA:  Right lower quadrant pain for 2 days. Vomiting and diarrhea. EXAM: CT ABDOMEN AND PELVIS WITH CONTRAST TECHNIQUE: Multidetector CT imaging of the abdomen and pelvis  was performed using the standard protocol following bolus administration of intravenous contrast. CONTRAST:  1 ISOVUE-300 IOPAMIDOL (ISOVUE-300) INJECTION 61% COMPARISON:  Multiple CT scans since Sep 01, 2003 FINDINGS: A few thin walled lung cysts are seen in the right lung base with no solid components. No suspicious nodules or masses. The lung bases are otherwise normal. No free air or free fluid. Bilateral renal stones are identified with the largest seen on the left measuring 6.7 mm on series 5, image 91. There is very mild stranding adjacent to the right kidney in addition to moderate hydronephrosis, not seen previously. The right ureter is prominent in caliber along its length with a 3.5 mm stone in the distal right ureter just above the UVJ. There is mild thickening of the ureteral wall at the level of the right distal ureteral stone, consistent with inflammation. Additionally, there is an ill-defined region of low attenuation in the anterior right kidney as seen on series 2, image 31 with no definitive focal adjacent fat stranding. This low-attenuation measures 2.6 by 1.4 cm. A few tiny low-attenuation lesions in both kidneys are likely cysts but too small to characterize. No other suspicious renal abnormalities. There is hepatic steatosis. The liver, gallbladder, portal vein, spleen, adrenal glands, and pancreas are within normal limits. The abdominal aorta is normal in appearance. No adenopathy. The stomach and small bowel are normal. The colon and visualized appendix are normal. The pelvis demonstrates a distal right ureteral stone as described above. No adenopathy or mass identified the pelvis. The prostate, seminal vesicles, and bladder are normal in appearance. The visualized bones are unremarkable. Delayed images demonstrate no filling defects in the upper renal collecting systems bilaterally. There is appropriate excretion into the right renal collecting system. IMPRESSION: 1. Obstructing stone in the  distal right ureter measuring 3.5 mm with moderate hydronephrosis and mild perinephric stranding. The region of the ill defined low-attenuation in the anterior right kidney could represent developing focal pyelonephritis. No discrete abscess seen on this study. Recommend clinical correlation and follow-up. 2. Multiple other bilateral renal stones. Electronically Signed   By: Gerome Samavid  Williams III M.D   On: 11/07/2015 18:38   I have discussed the case with Dr. Criss AlvineGoldston and reviewed the CT films and report and labs.   Assessment: Obstructing right distal stone with sepsis of a urinary origin.  He was given Zosyn prior to cultures.  I will take him to the OR for cystoscopy and right ureteral stenting and reviewed the risks in detail as noted in the consent attestation.     He is aware he will need a second procedure for stone removal.    He will be admitted to medicine.     CC: Dr. Pricilla LovelessScott Goldston.  Opal Dinning J 11/07/2015 [id number]

## 2015-11-07 NOTE — ED Notes (Signed)
Patient complains of RLQ pain described as sharp x 2 days. Vomiting and diarrhea with same.

## 2015-11-07 NOTE — ED Notes (Signed)
Could not override ibuprofen tab, called pharmacy, will give report to OR

## 2015-11-08 DIAGNOSIS — N201 Calculus of ureter: Secondary | ICD-10-CM

## 2015-11-08 DIAGNOSIS — N132 Hydronephrosis with renal and ureteral calculous obstruction: Secondary | ICD-10-CM

## 2015-11-08 DIAGNOSIS — Z9889 Other specified postprocedural states: Secondary | ICD-10-CM

## 2015-11-08 DIAGNOSIS — R7881 Bacteremia: Secondary | ICD-10-CM

## 2015-11-08 DIAGNOSIS — Z960 Presence of urogenital implants: Secondary | ICD-10-CM

## 2015-11-08 DIAGNOSIS — D649 Anemia, unspecified: Secondary | ICD-10-CM | POA: Diagnosis present

## 2015-11-08 DIAGNOSIS — B9561 Methicillin susceptible Staphylococcus aureus infection as the cause of diseases classified elsewhere: Secondary | ICD-10-CM

## 2015-11-08 DIAGNOSIS — N10 Acute tubulo-interstitial nephritis: Secondary | ICD-10-CM

## 2015-11-08 LAB — CBC
HCT: 34.4 % — ABNORMAL LOW (ref 39.0–52.0)
HEMATOCRIT: 42.8 % (ref 39.0–52.0)
HEMOGLOBIN: 11.6 g/dL — AB (ref 13.0–17.0)
Hemoglobin: 14.7 g/dL (ref 13.0–17.0)
MCH: 31.7 pg (ref 26.0–34.0)
MCH: 32.1 pg (ref 26.0–34.0)
MCHC: 33.7 g/dL (ref 30.0–36.0)
MCHC: 34.3 g/dL (ref 30.0–36.0)
MCV: 94 fL (ref 78.0–100.0)
MCV: 93.4 fL (ref 78.0–100.0)
Platelets: 193 10*3/uL (ref 150–400)
Platelets: 174 10*3/uL (ref 150–400)
RBC: 3.66 MIL/uL — AB (ref 4.22–5.81)
RBC: 4.58 MIL/uL (ref 4.22–5.81)
RDW: 12.6 % (ref 11.5–15.5)
RDW: 12.4 % (ref 11.5–15.5)
WBC: 21.3 10*3/uL — ABNORMAL HIGH (ref 4.0–10.5)
WBC: 18.5 10*3/uL — AB (ref 4.0–10.5)

## 2015-11-08 LAB — BLOOD CULTURE ID PANEL (REFLEXED)
ACINETOBACTER BAUMANNII: NOT DETECTED
CARBAPENEM RESISTANCE: NOT DETECTED
Candida albicans: NOT DETECTED
Candida glabrata: NOT DETECTED
Candida krusei: NOT DETECTED
Candida parapsilosis: NOT DETECTED
Candida tropicalis: NOT DETECTED
ENTEROBACTERIACEAE SPECIES: NOT DETECTED
ENTEROCOCCUS SPECIES: NOT DETECTED
Enterobacter cloacae complex: NOT DETECTED
Escherichia coli: NOT DETECTED
Haemophilus influenzae: NOT DETECTED
Klebsiella oxytoca: NOT DETECTED
Klebsiella pneumoniae: NOT DETECTED
Listeria monocytogenes: NOT DETECTED
Methicillin resistance: NOT DETECTED
NEISSERIA MENINGITIDIS: NOT DETECTED
PSEUDOMONAS AERUGINOSA: NOT DETECTED
Proteus species: NOT DETECTED
STAPHYLOCOCCUS AUREUS BCID: DETECTED — AB
STAPHYLOCOCCUS SPECIES: DETECTED — AB
STREPTOCOCCUS AGALACTIAE: NOT DETECTED
STREPTOCOCCUS SPECIES: NOT DETECTED
Serratia marcescens: NOT DETECTED
Streptococcus pneumoniae: NOT DETECTED
Streptococcus pyogenes: NOT DETECTED
VANCOMYCIN RESISTANCE: NOT DETECTED

## 2015-11-08 LAB — BASIC METABOLIC PANEL
ANION GAP: 11 (ref 5–15)
Anion gap: 4 — ABNORMAL LOW (ref 5–15)
BUN: 12 mg/dL (ref 6–20)
BUN: 9 mg/dL (ref 6–20)
CHLORIDE: 107 mmol/L (ref 101–111)
CHLORIDE: 101 mmol/L (ref 101–111)
CO2: 27 mmol/L (ref 22–32)
CO2: 21 mmol/L — AB (ref 22–32)
CREATININE: 0.87 mg/dL (ref 0.61–1.24)
Calcium: 8.4 mg/dL — ABNORMAL LOW (ref 8.9–10.3)
Calcium: 8.4 mg/dL — ABNORMAL LOW (ref 8.9–10.3)
Creatinine, Ser: 0.89 mg/dL (ref 0.61–1.24)
GFR calc Af Amer: >60 mL/min (ref >60)
GFR calc non Af Amer: >60 mL/min (ref >60)
GLUCOSE: 112 mg/dL — AB (ref 65–99)
Glucose, Bld: 111 mg/dL — ABNORMAL HIGH (ref 65–99)
POTASSIUM: 3.5 mmol/L (ref 3.5–5.1)
POTASSIUM: 3.4 mmol/L — AB (ref 3.5–5.1)
SODIUM: 138 mmol/L (ref 135–145)
Sodium: 133 mmol/L — ABNORMAL LOW (ref 135–145)

## 2015-11-08 LAB — LACTIC ACID, PLASMA
Lactic Acid, Venous: 0.9 mmol/L (ref 0.5–1.9)
Lactic Acid, Venous: 2 mmol/L (ref 0.5–1.9)

## 2015-11-08 LAB — MRSA PCR SCREENING: MRSA BY PCR: NEGATIVE

## 2015-11-08 MED ORDER — IBUPROFEN 400 MG PO TABS
400.0000 mg | ORAL_TABLET | ORAL | Status: DC | PRN
  Administered 2015-11-08 – 2015-11-12 (×12): 400 mg via ORAL
  Filled 2015-11-08 (×2): qty 2
  Filled 2015-11-08: qty 1
  Filled 2015-11-08 (×2): qty 2
  Filled 2015-11-08 (×4): qty 1
  Filled 2015-11-08: qty 2
  Filled 2015-11-08 (×2): qty 1

## 2015-11-08 MED ORDER — CEFAZOLIN IN D5W 1 GM/50ML IV SOLN
1.0000 g | Freq: Three times a day (TID) | INTRAVENOUS | Status: DC
  Filled 2015-11-08 (×2): qty 50

## 2015-11-08 MED ORDER — CEFAZOLIN SODIUM-DEXTROSE 2-4 GM/100ML-% IV SOLN
2.0000 g | Freq: Three times a day (TID) | INTRAVENOUS | Status: AC
  Administered 2015-11-08: 2 g via INTRAVENOUS
  Filled 2015-11-08: qty 100

## 2015-11-08 MED ORDER — SODIUM CHLORIDE 0.9 % IV BOLUS (SEPSIS)
500.0000 mL | Freq: Once | INTRAVENOUS | Status: AC
  Administered 2015-11-08: 500 mL via INTRAVENOUS

## 2015-11-08 MED ORDER — CEFAZOLIN SODIUM-DEXTROSE 2-4 GM/100ML-% IV SOLN
2.0000 g | Freq: Three times a day (TID) | INTRAVENOUS | Status: DC
  Administered 2015-11-08 – 2015-11-12 (×13): 2 g via INTRAVENOUS
  Filled 2015-11-08 (×15): qty 100

## 2015-11-08 MED ORDER — DIPHENHYDRAMINE HCL 25 MG PO CAPS
25.0000 mg | ORAL_CAPSULE | Freq: Once | ORAL | Status: AC
  Administered 2015-11-09: 25 mg via ORAL
  Filled 2015-11-08: qty 1

## 2015-11-08 MED ORDER — PHENAZOPYRIDINE HCL 100 MG PO TABS
200.0000 mg | ORAL_TABLET | Freq: Three times a day (TID) | ORAL | Status: DC | PRN
  Administered 2015-11-08: 200 mg via ORAL
  Filled 2015-11-08 (×2): qty 1

## 2015-11-08 MED ORDER — IBUPROFEN 200 MG PO TABS
400.0000 mg | ORAL_TABLET | Freq: Four times a day (QID) | ORAL | Status: DC | PRN
Start: 2015-11-08 — End: 2015-11-08
  Administered 2015-11-08 (×2): 400 mg via ORAL
  Filled 2015-11-08 (×2): qty 2

## 2015-11-08 MED ORDER — KETOROLAC TROMETHAMINE 30 MG/ML IJ SOLN
30.0000 mg | Freq: Four times a day (QID) | INTRAMUSCULAR | Status: DC | PRN
  Administered 2015-11-08 (×2): 30 mg via INTRAVENOUS
  Filled 2015-11-08 (×2): qty 1

## 2015-11-08 MED ORDER — HYDROMORPHONE HCL 1 MG/ML IJ SOLN
1.0000 mg | INTRAMUSCULAR | Status: DC | PRN
  Administered 2015-11-08 – 2015-11-12 (×16): 1 mg via INTRAVENOUS
  Filled 2015-11-08 (×16): qty 1

## 2015-11-08 NOTE — Op Note (Signed)
James Marshall:  [name], [name]               ACCOUNT NO.:  [id number]  MEDICAL RECORD NO.:  [id number]  LOCATION:  2C12C                        FACILITY:  [facility]  PHYSICIAN:  Excell SeltzerJohn J. Annabell HowellsWrenn, M.D.    DATE OF BIRTH:  10-11-1980  DATE OF PROCEDURE:  11/07/2015 DATE OF DISCHARGE:                              OPERATIVE REPORT   PROCEDURE:  Cystoscopy with insertion of right double-J stent.  PREOPERATIVE DIAGNOSIS:  Right distal ureteral stone with sepsis from urinary source.  POSTOPERATIVE DIAGNOSIS:  Right distal ureteral stone with sepsis from urinary source.  SURGEON:  Excell SeltzerJohn J. Annabell HowellsWrenn, M.D.  ANESTHESIA:  General.  SPECIMEN:  None.  DRAINS:  A 6-French x 26 cm right double-J stent.  BLOOD LOSS:  None.  COMPLICATIONS:  None.  INDICATIONS:  James Marshall is a 35 year old, African American male, who presented with the onset last night of right flank pain and fever.  He was found have a 3.5 mm right distal ureteral stone with some obstruction on CT and an infected urine.  His temp was a 103 and elevated lactic acid and white count.  It was felt that urgent stenting was indicated.  FINDINGS AND PROCEDURE:  He was given Zosyn earlier today.  He was taken to the operating room where general anesthetic was induced.  He was placed in lithotomy position.  His perineum and genitalia were prepped with Betadine solution.  He was draped in usual sterile fashion.  Cystoscopy was performed using the 22-French scope 30-degree lens. Examination revealed a normal urethra.  The external sphincter was intact.  Prostatic urethra showed obstruction.  Examination of bladder revealed smooth wall without tumor, stones, or inflammation.  Ureteral orifices were unremarkable.  The right ureteral orifice was cannulated with a Sensor guidewire, which was passed to the kidney under fluoroscopic guidance without difficulty. Once the wire was passed the stone, there was an efflux of purulent material.  A 6-French 26 cm  Contour double-J stent was then inserted over the wire under fluoroscopic guidance to the kidney without difficulty.  The wire was removed leaving good coil in the kidney and good coil in the bladder.  There was retained contrast in the kidney from the CT scan. At this point, the bladder was drained.  The cystoscope was removed. The patient was taken down from lithotomy position.  His anesthetic was reversed.  He was moved to recovery room in stable condition.  There were no complications.     Excell SeltzerJohn J. Annabell HowellsWrenn, M.D.     JJW/MEDQ  D:  11/07/2015  T:  11/08/2015  Job:  [id number]

## 2015-11-08 NOTE — Progress Notes (Signed)
PHARMACY - PHYSICIAN COMMUNICATION CRITICAL VALUE ALERT - BLOOD CULTURE IDENTIFICATION (BCID)  Results for orders placed or performed during the hospital encounter of 11/07/15  Blood Culture ID Panel (Reflexed) (Collected: 11/07/2015  5:15 PM)  Result Value Ref Range   Enterococcus species NOT DETECTED NOT DETECTED   Vancomycin resistance NOT DETECTED NOT DETECTED   Listeria monocytogenes NOT DETECTED NOT DETECTED   Staphylococcus species DETECTED (A) NOT DETECTED   Staphylococcus aureus DETECTED (A) NOT DETECTED   Methicillin resistance NOT DETECTED NOT DETECTED   Streptococcus species NOT DETECTED NOT DETECTED   Streptococcus agalactiae NOT DETECTED NOT DETECTED   Streptococcus pneumoniae NOT DETECTED NOT DETECTED   Streptococcus pyogenes NOT DETECTED NOT DETECTED   Acinetobacter baumannii NOT DETECTED NOT DETECTED   Enterobacteriaceae species NOT DETECTED NOT DETECTED   Enterobacter cloacae complex NOT DETECTED NOT DETECTED   Escherichia coli NOT DETECTED NOT DETECTED   Klebsiella oxytoca NOT DETECTED NOT DETECTED   Klebsiella pneumoniae NOT DETECTED NOT DETECTED   Proteus species NOT DETECTED NOT DETECTED   Serratia marcescens NOT DETECTED NOT DETECTED   Carbapenem resistance NOT DETECTED NOT DETECTED   Haemophilus influenzae NOT DETECTED NOT DETECTED   Neisseria meningitidis NOT DETECTED NOT DETECTED   Pseudomonas aeruginosa NOT DETECTED NOT DETECTED   Candida albicans NOT DETECTED NOT DETECTED   Candida glabrata NOT DETECTED NOT DETECTED   Candida krusei NOT DETECTED NOT DETECTED   Candida parapsilosis NOT DETECTED NOT DETECTED   Candida tropicalis NOT DETECTED NOT DETECTED    Name of physician (or Provider) Contacted: Text page to Dr. Blake DivineAkula  Changes to prescribed antibiotics required: Currently on zosyn, can likely narrow to cefazolin 2g IV q8, ID will be contacted via [employer].  Fredrik RiggerMarkle, Dazia Lippold Sue 11/08/2015  9:52 AM

## 2015-11-08 NOTE — Progress Notes (Signed)
Pharmacy Antibiotic Note  James Marshall is a 35 y.o. male admitted on 11/07/2015 with bacteremia.  Pharmacy has been consulted for Ancef dosing. Patient with BCID positive for MSSA.   Plan: Ancef 2g IV every 8 hours.  Follow-up renal function, culture results, and clinical status.   Height: 5\' 10"  (177.8 cm) Weight: 175 lb (79.379 kg) IBW/kg (Calculated) : 73  Temp (24hrs), Avg:100.7 F (38.2 C), Min:98 F (36.7 C), Max:103 F (39.4 C)   Recent Labs Lab 11/07/15 1315 11/07/15 1323 11/07/15 1638 11/07/15 2158 11/08/15 0017 11/08/15 0336  WBC 11.5*  --   --   --   --  21.3*  CREATININE 0.79  --   --   --   --  0.89  LATICACIDVEN  --  1.24 2.93* 1.2 0.9  --     Estimated Creatinine Clearance: 118.5 mL/min (by C-G formula based on Cr of 0.89).    Allergies  Allergen Reactions  . Hydrocodone Nausea And Vomiting    Antimicrobials this admission: 7/8 Zosyn>>7/9 7/9 Ancef >>  Dose adjustments this admission: n/a  Microbiology results: 7/8 BCID - MSSA and MSSE 7/8 Blood >> 2/2 GPC clusters 7/8 MRSA pcr negative   Thank you for allowing pharmacy to be a part of this patient's care.  Link SnufferJessica Willodean Leven, PharmD, BCPS Clinical Pharmacist [phone] 11/08/2015 12:14 PM

## 2015-11-08 NOTE — Progress Notes (Signed)
CRITICAL VALUE ALERT  Critical value received:  Lactic Acid 2.0  Date of notification:  11/08/2015  Time of notification:  2346  Critical value read back: Yes  Nurse who received alert:  Scherrie BatemanShraddha Naomy Esham RN  MD notified (1st page):  NP L. [place]uk  Time of first page:  2348  MD notified (2nd page):  Time of second page:  Responding MD:   Time MD responded:

## 2015-11-08 NOTE — Consult Note (Signed)
[facility]    Date of Admission:  11/07/2015   Total days of antibiotics 1               Reason for Consult: Automatic consultation for Staph aureus bacteremia     Principal Problem:   Staphylococcus aureus bacteremia Active Problems:   Sepsis (HCC)   Acute pyelonephritis   Right ureteral stone   Obstructive uropathy   Normocytic anemia   .  ceFAZolin (ANCEF) IV  2 g Intravenous Q8H  . sodium chloride flush  3 mL Intravenous Q12H    Recommendations: 1. Agree with narrowing of antibiotic therapy to IV cefazolin 2. Repeat blood cultures in a.m. 3. Transthoracic echocardiogram   Assessment: Mr. James Marshall has an obstructing right ureteral stone complicated by pyelonephritis and MSSA bacteremia. His anabolic therapy has been appropriately narrowed to cefazolin. I've ordered repeat blood cultures was for tomorrow morning and a transthoracic echocardiogram.   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 2 days ago he had sudden onset of severe right lower quadrant and flank pain. It seemed to get a little bit better then woke him up at 4 AM. He started having fevers and chills and came to the emergency department. His temperature was 102 and septic. CT scan of his abdomen revealed an obstructing right ureteral stone with hydronephrosis. He underwent emergent cystoscopy with placement of a double-J stent. At the time of stenting purulent urine was noted to drain from his right kidney. Both admission blood cultures have already grown MSSA. He had one episode of bilateral pyelonephritis about 10 years ago but otherwise has been in very good health. He has never had kidney stones before that he is aware of.   Review of Systems: Review of Systems  Constitutional: Positive for fever, chills and malaise/fatigue. Negative for weight loss and diaphoresis.  HENT: Negative for sore throat.   Respiratory: Negative for  cough, sputum production and shortness of breath.   Cardiovascular: Negative for chest pain.  Gastrointestinal: Positive for abdominal pain. Negative for nausea, vomiting and diarrhea.  Genitourinary: Positive for flank pain. Negative for dysuria and frequency.  Musculoskeletal: Negative for myalgias and joint pain.  Skin: Negative for rash.  Neurological: Negative for headaches.    Past Medical History  Diagnosis Date  . Acute pyelonephritis     Social History  Substance Use Topics  . Smoking status: Former Games developer  . Smokeless tobacco: None  . Alcohol Use: Yes     Comment: 2-3 times per week, no history of EtOH withdrawal    Family History  Problem Relation Age of Onset  . Cervical cancer Mother   . Diabetes Paternal Grandfather   . Prostate cancer Paternal Grandfather   . Heart disease Maternal Grandfather    Allergies  Allergen Reactions  . Hydrocodone Nausea And Vomiting    OBJECTIVE: Blood pressure 142/82, pulse 87, temperature 99.5 F (37.5 C), temperature source Oral, resp. rate 25, height  (1.778 m), weight 175 lb (79.379 kg), SpO2 98 %.  Physical Exam  Constitutional: He is oriented to person, place, and time.  He is resting quietly in bed. Several family members are present.  HENT:  Mouth/Throat: No oropharyngeal exudate.  Cardiovascular: Regular rhythm.   No murmur heard. Tachycardic.  Pulmonary/Chest: Effort normal and breath sounds normal.  Abdominal: Soft. He exhibits no mass. There is tenderness. There is no guarding.  Neurological: He is alert and oriented to person, place, and time.  Skin: No rash noted.  Psychiatric: Mood and affect normal.    Lab Results Lab Results  Component Value Date   WBC 21.3* 11/08/2015   HGB 11.6* 11/08/2015   HCT 34.4* 11/08/2015   MCV 94.0 11/08/2015   PLT 193 11/08/2015    Lab Results  Component Value Date   CREATININE 0.89 11/08/2015   BUN 12 11/08/2015   NA 138 11/08/2015   K 3.5 11/08/2015   CL  107 11/08/2015   CO2 27 11/08/2015    Lab Results  Component Value Date   ALT 21 11/07/2015   AST 28 11/07/2015   ALKPHOS 64 11/07/2015   BILITOT 0.8 11/07/2015     Microbiology: Recent Results (from the past 240 hour(s))  Blood Culture (routine x 2)     Status: None (Preliminary result)   Collection Time: 11/07/15  5:15 PM  Result Value Ref Range Status   Specimen Description BLOOD RIGHT ANTECUBITAL  Final   Special Requests BOTTLES DRAWN AEROBIC AND ANAEROBIC 5CC  Final   Culture  Setup Time   Final    GRAM POSITIVE COCCI IN CLUSTERS IN BOTH AEROBIC AND ANAEROBIC BOTTLES Organism ID to follow CRITICAL RESULT CALLED TO, READ BACK BY AND VERIFIED WITH: J [facility] IncMARKEL 11/08/15 @ 0935 M VESTAL    Culture GRAM POSITIVE COCCI  Final   Report Status PENDING  Incomplete  Blood Culture ID Panel (Reflexed)     Status: Abnormal   Collection Time: 11/07/15  5:15 PM  Result Value Ref Range Status   Enterococcus species NOT DETECTED NOT DETECTED Final   Vancomycin resistance NOT DETECTED NOT DETECTED Final   Listeria monocytogenes NOT DETECTED NOT DETECTED Final   Staphylococcus species DETECTED (A) NOT DETECTED Final    Comment: CRITICAL RESULT CALLED TO, READ BACK BY AND VERIFIED WITH: J MARKEL 11/08/15 @ 0935 M VESTAL    Staphylococcus aureus DETECTED (A) NOT DETECTED Final    Comment: CRITICAL RESULT CALLED TO, READ BACK BY AND VERIFIED WITH: J MARKEL 11/08/15 @ 0935 M VESTAL    Methicillin resistance NOT DETECTED NOT DETECTED Final   Streptococcus species NOT DETECTED NOT DETECTED Final   Streptococcus agalactiae NOT DETECTED NOT DETECTED Final   Streptococcus pneumoniae NOT DETECTED NOT DETECTED Final   Streptococcus pyogenes NOT DETECTED NOT DETECTED Final   Acinetobacter baumannii NOT DETECTED NOT DETECTED Final   Enterobacteriaceae species NOT DETECTED NOT DETECTED Final   Enterobacter cloacae complex NOT DETECTED NOT DETECTED Final   Escherichia coli NOT DETECTED NOT DETECTED Final     Klebsiella oxytoca NOT DETECTED NOT DETECTED Final   Klebsiella pneumoniae NOT DETECTED NOT DETECTED Final   Proteus species NOT DETECTED NOT DETECTED Final   Serratia marcescens NOT DETECTED NOT DETECTED Final   Carbapenem resistance NOT DETECTED NOT DETECTED Final   Haemophilus influenzae NOT DETECTED NOT DETECTED Final   Neisseria meningitidis NOT DETECTED NOT DETECTED Final   Pseudomonas aeruginosa NOT DETECTED NOT DETECTED Final   Candida albicans NOT DETECTED NOT DETECTED Final   Candida glabrata NOT DETECTED NOT DETECTED Final   Candida krusei NOT DETECTED NOT DETECTED Final   Candida parapsilosis NOT DETECTED NOT DETECTED Final   Candida tropicalis NOT DETECTED NOT DETECTED Final  Blood Culture (routine x 2)     Status: None (Preliminary result)   Collection Time: 11/07/15  5:24 PM  Result Value Ref Range Status   Specimen Description BLOOD RIGHT HAND  Final   Special Requests BOTTLES DRAWN AEROBIC AND ANAEROBIC 5CC  Final   Culture  Setup Time   Final    GRAM POSITIVE COCCI IN CLUSTERS ANAEROBIC BOTTLE ONLY CRITICAL RESULT CALLED TO, READ BACK BY AND VERIFIED WITH: J [facility] 11/08/15 @ 0935 M VESTAL    Culture GRAM POSITIVE COCCI  Final   Report Status PENDING  Incomplete  MRSA PCR Screening     Status: None   Collection Time: 11/07/15 10:38 PM  Result Value Ref Range Status   MRSA by PCR NEGATIVE NEGATIVE Final    Comment:        The GeneXpert MRSA Assay (FDA approved for NASAL specimens only), is one component of a comprehensive MRSA colonization surveillance program. It is not intended to diagnose MRSA infection nor to guide or monitor treatment for MRSA infections.     Cliffton Asters, MD [facility] for Infectious Disease [facility] [phone] pager   [phone] cell 11/08/2015, 2:04 PM

## 2015-11-08 NOTE — Progress Notes (Signed)
Patient ID: James Marshall, male   DOB: 05/30/1980, 35 y.o.   MRN: [id number] 1 Day Post-Op  Subjective: James Marshall continues to have some pain and urethral discomfort following stent placement.   He remains intermittently febrile with a leukocytosis but his lactic acid has normalized.   He has no associated signs or symptoms. ROS:  Review of Systems  Constitutional: Positive for fever and chills.  Gastrointestinal: Positive for abdominal pain.  Genitourinary: Positive for dysuria and flank pain.  All other systems reviewed and are negative.   Anti-infectives: Anti-infectives    Start     Dose/Rate Route Frequency Ordered Stop   11/08/15 0100  piperacillin-tazobactam (ZOSYN) IVPB 3.375 g     3.375 g 12.5 mL/hr over 240 Minutes Intravenous Every 8 hours 11/07/15 1657     11/07/15 1700  piperacillin-tazobactam (ZOSYN) IVPB 3.375 g     3.375 g 100 mL/hr over 30 Minutes Intravenous  Once 11/07/15 1646 11/07/15 1757      Current Facility-Administered Medications  Medication Dose Route Frequency Provider Last Rate Last Dose  . 0.9 %  sodium chloride infusion   Intravenous Continuous Michael LitterNikki Carter, MD 125 mL/hr at 11/08/15 0544    . acetaminophen (TYLENOL) tablet 650 mg  650 mg Oral Q6H PRN Michael LitterNikki Carter, MD   650 mg at 11/08/15 0845   Or  . acetaminophen (TYLENOL) suppository 650 mg  650 mg Rectal Q6H PRN Michael LitterNikki Carter, MD      . HYDROmorphone (DILAUDID) injection 1 mg  1 mg Intravenous Q4H PRN Michael LitterNikki Carter, MD   1 mg at 11/08/15 0542  . ketorolac (TORADOL) 30 MG/ML injection 30 mg  30 mg Intravenous Q6H PRN Bjorn PippinJohn Jquan Egelston, MD      . ondansetron [facility](ZOFRAN) tablet 4 mg  4 mg Oral Q6H PRN Michael LitterNikki Carter, MD       Or  . ondansetron [facility](ZOFRAN) injection 4 mg  4 mg Intravenous Q6H PRN Michael LitterNikki Carter, MD   4 mg at 11/08/15 0046  . piperacillin-tazobactam (ZOSYN) IVPB 3.375 g  3.375 g Intravenous Q8H Emi HolesJennifer S Markle, RPH   3.375 g at 11/08/15 0846  . sodium chloride flush (NS) 0.9 % injection 3 mL  3 mL  Intravenous Q12H Michael LitterNikki Carter, MD   3 mL at 11/07/15 2245     Objective: Vital signs in last 24 hours: Temp:  [98 F (36.7 C)-103 F (39.4 C)] 100.1 F (37.8 C) (07/09 0800) Pulse Rate:  [84-115] 86 (07/09 0800) Resp:  [10-25] 16 (07/09 0800) BP: (111-155)/(68-94) 142/94 mmHg (07/09 0800) SpO2:  [92 %-99 %] 98 % (07/09 0800) Weight:  [79.379 kg (175 lb)] 79.379 kg (175 lb) (07/08 1308)  Intake/Output from previous day: 07/08 0701 - 07/09 0700 In: 4614.6 [I.V.:4564.6; IV Piggyback:50] Out: 480 [Urine:475; Blood:5] Intake/Output this shift:     Physical Exam  Constitutional: He is oriented to person, place, and time and well-developed, well-nourished, and in no distress.  Cardiovascular: Normal rate, regular rhythm and normal heart sounds.   Pulmonary/Chest: Effort normal and breath sounds normal. No respiratory distress.  Abdominal: There is tenderness. There is guarding.  Firm with RLQ and RUQ tenderness.    Musculoskeletal: Normal range of motion. He exhibits no edema or tenderness.  Neurological: He is alert and oriented to person, place, and time.  Skin: Skin is warm and dry.  Psychiatric: Mood and affect normal.  Vitals reviewed.   Lab Results:   Recent Labs  11/07/15 1315 11/08/15 0336  WBC 11.5* 21.3*  HGB 14.8 11.6*  HCT 41.8 34.4*  PLT 307 193   BMET  Recent Labs  11/07/15 1315 11/08/15 0336  NA 139 138  K 3.5 3.5  CL 106 107  CO2 23 27  GLUCOSE 141* 111*  BUN 13 12  CREATININE 0.79 0.89  CALCIUM 9.5 8.4*   PT/INR  Recent Labs  11/07/15 2150  LABPROT 15.2  INR 1.18   ABG No results for input(s): PHART, HCO3 in the last 72 hours.  Invalid input(s): PCO2, PO2  Studies/Results: Ct Abdomen Pelvis W Contrast  11/07/2015  CLINICAL DATA:  Right lower quadrant pain for 2 days. Vomiting and diarrhea. EXAM: CT ABDOMEN AND PELVIS WITH CONTRAST TECHNIQUE: Multidetector CT imaging of the abdomen and pelvis was performed using the standard  protocol following bolus administration of intravenous contrast. CONTRAST:  1 ISOVUE-300 IOPAMIDOL (ISOVUE-300) INJECTION 61% COMPARISON:  Multiple CT scans since Sep 01, 2003 FINDINGS: A few thin walled lung cysts are seen in the right lung base with no solid components. No suspicious nodules or masses. The lung bases are otherwise normal. No free air or free fluid. Bilateral renal stones are identified with the largest seen on the left measuring 6.7 mm on series 5, image 91. There is very mild stranding adjacent to the right kidney in addition to moderate hydronephrosis, not seen previously. The right ureter is prominent in caliber along its length with a 3.5 mm stone in the distal right ureter just above the UVJ. There is mild thickening of the ureteral wall at the level of the right distal ureteral stone, consistent with inflammation. Additionally, there is an ill-defined region of low attenuation in the anterior right kidney as seen on series 2, image 31 with no definitive focal adjacent fat stranding. This low-attenuation measures 2.6 by 1.4 cm. A few tiny low-attenuation lesions in both kidneys are likely cysts but too small to characterize. No other suspicious renal abnormalities. There is hepatic steatosis. The liver, gallbladder, portal vein, spleen, adrenal glands, and pancreas are within normal limits. The abdominal aorta is normal in appearance. No adenopathy. The stomach and small bowel are normal. The colon and visualized appendix are normal. The pelvis demonstrates a distal right ureteral stone as described above. No adenopathy or mass identified the pelvis. The prostate, seminal vesicles, and bladder are normal in appearance. The visualized bones are unremarkable. Delayed images demonstrate no filling defects in the upper renal collecting systems bilaterally. There is appropriate excretion into the right renal collecting system. IMPRESSION: 1. Obstructing stone in the distal right ureter measuring  3.5 mm with moderate hydronephrosis and mild perinephric stranding. The region of the ill defined low-attenuation in the anterior right kidney could represent developing focal pyelonephritis. No discrete abscess seen on this study. Recommend clinical correlation and follow-up. 2. Multiple other bilateral renal stones. Electronically Signed   By: Gerome Sam III M.D   On: 11/07/2015 18:38   Dg Cystogram  11/07/2015  CLINICAL DATA:  Acute pyelonephritis EXAM: CYSTOGRAM TECHNIQUE: Cystoscopy was performed by the urologist. Spot intraoperative fluoroscopic images were provided. FLUOROSCOPY TIME:  9 seconds COMPARISON:  CT abdomen pelvis dated 11/07/2015 FINDINGS: Frontal and lateral spot fluoroscopic images have been provided during the procedure. Contrast is present in the bladder. A right ureteral stent is incompletely visualized. IMPRESSION: Intraoperative fluoroscopic images during cystoscopy, as described above. Electronically Signed   By: Charline Bills M.D.   On: 11/07/2015 21:35   Dg Chest Port 1 View  11/07/2015  CLINICAL DATA:  Acute onset  of sepsis.  Initial encounter. EXAM: PORTABLE CHEST 1 VIEW COMPARISON:  Thoracic spine radiographs performed 03/21/2009 FINDINGS: The lungs are well-aerated. Mild vascular congestion is suggested. There is no evidence of focal opacification, pleural effusion or pneumothorax. The cardiomediastinal silhouette is borderline normal in size. No acute osseous abnormalities are seen. IMPRESSION: Mild vascular congestion suggested.  Lungs remain grossly clear. Electronically Signed   By: Roanna Raider M.D.   On: 11/07/2015 22:47   Labs reviewed.   Assessment and Plan: Right ureteral stone with sepsis improving on abx post stent insertion.     Continue current care and transition to po abx when cultures are back.   I have added toradol and pyridium for his pain complaints.  He will be scheduled for ureteroscopy for stone removal in 7-10 days.        LOS:  1 day    Trino Higinbotham J 11/08/2015 [phone]

## 2015-11-08 NOTE — Progress Notes (Signed)
11/08/2015 Notified Dr Blake DivineAkula for temp of 100.1 orally.   Patient was given Tylenol 650mg  at 0845 but patient stated tylenol does not work. Dr Annabell HowellsWrenn urology was made aware about patient temp and order Toradol 30mg . Family stated patient ibuprofen normally he was given that when he was in pacu. Rn notified Dr Blake DivineAkula about ibuprofen and order was placed to be given every 6 hours prn. Patient continue to have temp and family ask if ibuprofen can be given every 4 hour instead of every 6 hours. Dr Blake DivineAkula change ibuprofen to every 4 hours. Patient temperature was monitor patient temp went up to 102.7  orallywas also taken rectally it was103.1 rectally. Dr Blake DivineAkula notified about temp via amion. Order was given for patient to have a cooling blanket and secretary was made aware to order cooling blanket.Rn went to patient room to let family know the plan. [facility] Center[facility]adine Aleera Gilcrease RN.

## 2015-11-08 NOTE — Progress Notes (Addendum)
PROGRESS NOTE    James Marshall  JSH:[id number] DOB: [birth year] DOA: 11/07/2015 PCP: No primary care provider on file.    Brief Narrative: James Marshall is a 35 y.o. gentleman with a history of prior pyelonephritis who presents to the ED  RLQ abodminal pain with fever, nausea, and vomiting. Found to have sepsis from pyelonephritis from right renal stone.   Assessment & Plan:   Principal Problem:   Staphylococcus aureus bacteremia Active Problems:   Right ureteral stone   Sepsis (HCC)   Obstructive uropathy   Acute pyelonephritis   Normocytic anemia   sepsis from pyelonephritis from right ureteral stone: Admitted to step down for IV antibiotics , urology consulted and underwent cystoscopy with insertion of the right double j stent.  IV fluids, pain control .  Plan for ureteroscopy for stone removal in 7-10 days by urology.  Sepsis improving.  Fever coming down.  Lactic acid normalized.  Monitor wbc count.  Notified by ID that Blood cultures show MSSA, changed IV zosyn to IV ancef, though sensitivities are pending.     Normocytic anemia: Monitor.      DVT prophylaxis: (Lovenox/ Code Status: (Full code Family Communication: multiple family members at bedside.  Disposition Plan: pending further management.    Consultants:   Urology.    Procedures:  Cystoscopy with insertion of right double-J stent placement on 7/8  Antimicrobials: IV ZOSYN FROM 7/8 to 7/9 IV ancef FROM 7/9/   Subjective: Pain improving.   Objective: Filed Vitals:   11/08/15 1034 11/08/15 1100 11/08/15 1149 11/08/15 1200  BP:  133/80  142/82  Pulse: 92 93  87  Temp: 100.8 F (38.2 C)  99.9 F (37.7 C)   TempSrc: Oral  Oral   Resp: 19 20  25   Height:      Weight:      SpO2: 98% 98%  98%    Intake/Output Summary (Last 24 hours) at 11/08/15 1222 Last data filed at 11/08/15 1221  Gross per 24 hour  Intake 4664.58 ml  Output   1130 ml  Net 3534.58 ml   Filed Weights   11/07/15  1308  Weight: 79.379 kg (175 lb)    Examination:  General exam: Appears anxious but not in any disress.  Respiratory system: Clear to auscultation. Respiratory effort normal. Cardiovascular system: S1 & S2 heard, RRR. No JVD, murmurs, rubs, gallops or clicks. No pedal edema. Gastrointestinal system: right upper quadrnat and flank pain on the right present, with some guarding, no signs of peritonitis. Non distended abdomen and normal bowel sounds.  Central nervous system: Alert and oriented. No focal neurological deficits. Extremities: Symmetric 5 x 5 power. Skin: No rashes, lesions or ulcers Psychiatry: Judgement and insight appear normal. Mood & affect appropriate.     Data Reviewed: I have personally reviewed following labs and imaging studies  CBC:  Recent Labs Lab 11/07/15 1315 11/08/15 0336  WBC 11.5* 21.3*  HGB 14.8 11.6*  HCT 41.8 34.4*  MCV 91.3 94.0  PLT 307 193   Basic Metabolic Panel:  Recent Labs Lab 11/07/15 1315 11/08/15 0336  NA 139 138  K 3.5 3.5  CL 106 107  CO2 23 27  GLUCOSE 141* 111*  BUN 13 12  CREATININE 0.79 0.89  CALCIUM 9.5 8.4*   GFR: Estimated Creatinine Clearance: 118.5 mL/min (by C-G formula based on Cr of 0.89). Liver Function Tests:  Recent Labs Lab 11/07/15 1315  AST 28  ALT 21  ALKPHOS 64  BILITOT 0.8  PROT 7.3  ALBUMIN 4.3    Recent Labs Lab 11/07/15 1315  LIPASE 25   No results for input(s): AMMONIA in the last 168 hours. Coagulation Profile:  Recent Labs Lab 11/07/15 2150  INR 1.18   Cardiac Enzymes: No results for input(s): CKTOTAL, CKMB, CKMBINDEX, TROPONINI in the last 168 hours. BNP (last 3 results) No results for input(s): PROBNP in the last 8760 hours. HbA1C: No results for input(s): HGBA1C in the last 72 hours. CBG: No results for input(s): GLUCAP in the last 168 hours. Lipid Profile: No results for input(s): CHOL, HDL, LDLCALC, TRIG, CHOLHDL, LDLDIRECT in the last 72 hours. Thyroid Function  Tests: No results for input(s): TSH, T4TOTAL, FREET4, T3FREE, THYROIDAB in the last 72 hours. Anemia Panel: No results for input(s): VITAMINB12, FOLATE, FERRITIN, TIBC, IRON, RETICCTPCT in the last 72 hours. Sepsis Labs:  Recent Labs Lab 11/07/15 1323 11/07/15 1638 11/07/15 2150 11/07/15 2158 11/08/15 0017  PROCALCITON  --   --  1.17  --   --   LATICACIDVEN 1.24 2.93*  --  1.2 0.9    Recent Results (from the past 240 hour(s))  Blood Culture (routine x 2)     Status: None (Preliminary result)   Collection Time: 11/07/15  5:15 PM  Result Value Ref Range Status   Specimen Description BLOOD RIGHT ANTECUBITAL  Final   Special Requests BOTTLES DRAWN AEROBIC AND ANAEROBIC 5CC  Final   Culture  Setup Time   Final    GRAM POSITIVE COCCI IN CLUSTERS IN BOTH AEROBIC AND ANAEROBIC BOTTLES Organism ID to follow CRITICAL RESULT CALLED TO, READ BACK BY AND VERIFIED WITH: J MARKEL 11/08/15 @ 0935 M VESTAL    Culture GRAM POSITIVE COCCI  Final   Report Status PENDING  Incomplete  Blood Culture ID Panel (Reflexed)     Status: Abnormal   Collection Time: 11/07/15  5:15 PM  Result Value Ref Range Status   Enterococcus species NOT DETECTED NOT DETECTED Final   Vancomycin resistance NOT DETECTED NOT DETECTED Final   Listeria monocytogenes NOT DETECTED NOT DETECTED Final   Staphylococcus species DETECTED (A) NOT DETECTED Final    Comment: CRITICAL RESULT CALLED TO, READ BACK BY AND VERIFIED WITH: J MARKEL 11/08/15 @ 0935 M VESTAL    Staphylococcus aureus DETECTED (A) NOT DETECTED Final    Comment: CRITICAL RESULT CALLED TO, READ BACK BY AND VERIFIED WITH: J MARKEL 11/08/15 @ 0935 M VESTAL    Methicillin resistance NOT DETECTED NOT DETECTED Final   Streptococcus species NOT DETECTED NOT DETECTED Final   Streptococcus agalactiae NOT DETECTED NOT DETECTED Final   Streptococcus pneumoniae NOT DETECTED NOT DETECTED Final   Streptococcus pyogenes NOT DETECTED NOT DETECTED Final   Acinetobacter  baumannii NOT DETECTED NOT DETECTED Final   Enterobacteriaceae species NOT DETECTED NOT DETECTED Final   Enterobacter cloacae complex NOT DETECTED NOT DETECTED Final   Escherichia coli NOT DETECTED NOT DETECTED Final   Klebsiella oxytoca NOT DETECTED NOT DETECTED Final   Klebsiella pneumoniae NOT DETECTED NOT DETECTED Final   Proteus species NOT DETECTED NOT DETECTED Final   Serratia marcescens NOT DETECTED NOT DETECTED Final   Carbapenem resistance NOT DETECTED NOT DETECTED Final   Haemophilus influenzae NOT DETECTED NOT DETECTED Final   Neisseria meningitidis NOT DETECTED NOT DETECTED Final   Pseudomonas aeruginosa NOT DETECTED NOT DETECTED Final   Candida albicans NOT DETECTED NOT DETECTED Final   Candida glabrata NOT DETECTED NOT DETECTED Final   Candida krusei NOT DETECTED NOT DETECTED Final  Candida parapsilosis NOT DETECTED NOT DETECTED Final   Candida tropicalis NOT DETECTED NOT DETECTED Final  Blood Culture (routine x 2)     Status: None (Preliminary result)   Collection Time: 11/07/15  5:24 PM  Result Value Ref Range Status   Specimen Description BLOOD RIGHT HAND  Final   Special Requests BOTTLES DRAWN AEROBIC AND ANAEROBIC 5CC  Final   Culture  Setup Time   Final    GRAM POSITIVE COCCI IN CLUSTERS ANAEROBIC BOTTLE ONLY CRITICAL RESULT CALLED TO, READ BACK BY AND VERIFIED WITH: J [facility] IncMARKEL 11/08/15 @ 0935 M VESTAL    Culture GRAM POSITIVE COCCI  Final   Report Status PENDING  Incomplete  MRSA PCR Screening     Status: None   Collection Time: 11/07/15 10:38 PM  Result Value Ref Range Status   MRSA by PCR NEGATIVE NEGATIVE Final    Comment:        The GeneXpert MRSA Assay (FDA approved for NASAL specimens only), is one component of a comprehensive MRSA colonization surveillance program. It is not intended to diagnose MRSA infection nor to guide or monitor treatment for MRSA infections.          Radiology Studies: Ct Abdomen Pelvis W Contrast  11/07/2015   CLINICAL DATA:  Right lower quadrant pain for 2 days. Vomiting and diarrhea. EXAM: CT ABDOMEN AND PELVIS WITH CONTRAST TECHNIQUE: Multidetector CT imaging of the abdomen and pelvis was performed using the standard protocol following bolus administration of intravenous contrast. CONTRAST:  1 ISOVUE-300 IOPAMIDOL (ISOVUE-300) INJECTION 61% COMPARISON:  Multiple CT scans since Sep 01, 2003 FINDINGS: A few thin walled lung cysts are seen in the right lung base with no solid components. No suspicious nodules or masses. The lung bases are otherwise normal. No free air or free fluid. Bilateral renal stones are identified with the largest seen on the left measuring 6.7 mm on series 5, image 91. There is very mild stranding adjacent to the right kidney in addition to moderate hydronephrosis, not seen previously. The right ureter is prominent in caliber along its length with a 3.5 mm stone in the distal right ureter just above the UVJ. There is mild thickening of the ureteral wall at the level of the right distal ureteral stone, consistent with inflammation. Additionally, there is an ill-defined region of low attenuation in the anterior right kidney as seen on series 2, image 31 with no definitive focal adjacent fat stranding. This low-attenuation measures 2.6 by 1.4 cm. A few tiny low-attenuation lesions in both kidneys are likely cysts but too small to characterize. No other suspicious renal abnormalities. There is hepatic steatosis. The liver, gallbladder, portal vein, spleen, adrenal glands, and pancreas are within normal limits. The abdominal aorta is normal in appearance. No adenopathy. The stomach and small bowel are normal. The colon and visualized appendix are normal. The pelvis demonstrates a distal right ureteral stone as described above. No adenopathy or mass identified the pelvis. The prostate, seminal vesicles, and bladder are normal in appearance. The visualized bones are unremarkable. Delayed images demonstrate  no filling defects in the upper renal collecting systems bilaterally. There is appropriate excretion into the right renal collecting system. IMPRESSION: 1. Obstructing stone in the distal right ureter measuring 3.5 mm with moderate hydronephrosis and mild perinephric stranding. The region of the ill defined low-attenuation in the anterior right kidney could represent developing focal pyelonephritis. No discrete abscess seen on this study. Recommend clinical correlation and follow-up. 2. Multiple other bilateral renal stones.  Electronically Signed   By: Gerome Sam III M.D   On: 11/07/2015 18:38   Dg Cystogram  11/07/2015  CLINICAL DATA:  Acute pyelonephritis EXAM: CYSTOGRAM TECHNIQUE: Cystoscopy was performed by the urologist. Spot intraoperative fluoroscopic images were provided. FLUOROSCOPY TIME:  9 seconds COMPARISON:  CT abdomen pelvis dated 11/07/2015 FINDINGS: Frontal and lateral spot fluoroscopic images have been provided during the procedure. Contrast is present in the bladder. A right ureteral stent is incompletely visualized. IMPRESSION: Intraoperative fluoroscopic images during cystoscopy, as described above. Electronically Signed   By: Charline Bills M.D.   On: 11/07/2015 21:35   Dg Chest Port 1 View  11/07/2015  CLINICAL DATA:  Acute onset of sepsis.  Initial encounter. EXAM: PORTABLE CHEST 1 VIEW COMPARISON:  Thoracic spine radiographs performed 03/21/2009 FINDINGS: The lungs are well-aerated. Mild vascular congestion is suggested. There is no evidence of focal opacification, pleural effusion or pneumothorax. The cardiomediastinal silhouette is borderline normal in size. No acute osseous abnormalities are seen. IMPRESSION: Mild vascular congestion suggested.  Lungs remain grossly clear. Electronically Signed   By: Roanna Raider M.D.   On: 11/07/2015 22:47        Scheduled Meds: .  ceFAZolin (ANCEF) IV  2 g Intravenous Q8H  . sodium chloride flush  3 mL Intravenous Q12H    Continuous Infusions: . sodium chloride 125 mL/hr at 11/08/15 0544     LOS: 1 day    Time spent: 30 minutes.     Kathlen Mody, MD [facility] Pager [phone]  If 7PM-7AM, please contact night-coverage [email or web address] Password [email or web address] 11/08/2015, 12:22 PM

## 2015-11-09 ENCOUNTER — Encounter (HOSPITAL_COMMUNITY): Payer: Self-pay | Admitting: Urology

## 2015-11-09 ENCOUNTER — Inpatient Hospital Stay (HOSPITAL_COMMUNITY): Payer: BC Managed Care – PPO

## 2015-11-09 DIAGNOSIS — R7881 Bacteremia: Secondary | ICD-10-CM

## 2015-11-09 DIAGNOSIS — R509 Fever, unspecified: Secondary | ICD-10-CM

## 2015-11-09 LAB — CBC
HEMATOCRIT: 40.3 % (ref 39.0–52.0)
Hemoglobin: 13.8 g/dL (ref 13.0–17.0)
MCH: 31.7 pg (ref 26.0–34.0)
MCHC: 34.2 g/dL (ref 30.0–36.0)
MCV: 92.4 fL (ref 78.0–100.0)
Platelets: 150 10*3/uL (ref 150–400)
RBC: 4.36 MIL/uL (ref 4.22–5.81)
RDW: 12.2 % (ref 11.5–15.5)
WBC: 16 10*3/uL — AB (ref 4.0–10.5)

## 2015-11-09 LAB — URINE CULTURE: Culture: >=100000 — AB

## 2015-11-09 LAB — ECHOCARDIOGRAM COMPLETE
E decel time: 172 msec
EERAT: 6.83
FS: 36 % (ref 28–44)
HEIGHTINCHES: 70 in
IVS/LV PW RATIO, ED: 1.09
LA diam end sys: 34 mm
LA diam index: 1.73 cm/m2
LA vol A4C: 36.5 ml
LA vol index: 25.5 mL/m2
LASIZE: 34 mm
LAVOL: 50.2 mL
LV TDI E'MEDIAL: 9.03
LVEEAVG: 6.83
LVEEMED: 6.83
LVELAT: 18 cm/s
LVOT area: 4.15 cm2
LVOT diameter: 23 mm
MV Dec: 172
MV Peak grad: 6 mmHg
MV pk E vel: 123 m/s
MVPKAVEL: 86.6 m/s
PW: 8.67 mm — AB (ref 0.6–1.1)
Reg peak vel: 229 cm/s
TAPSE: 27.2 mm
TDI e' lateral: 18
TRMAXVEL: 229 cm/s
WEIGHTICAEL: 2800 [oz_av]

## 2015-11-09 LAB — BASIC METABOLIC PANEL
ANION GAP: 7 (ref 5–15)
BUN: 6 mg/dL (ref 6–20)
CALCIUM: 8.4 mg/dL — AB (ref 8.9–10.3)
CHLORIDE: 104 mmol/L (ref 101–111)
CO2: 24 mmol/L (ref 22–32)
Creatinine, Ser: 0.76 mg/dL (ref 0.61–1.24)
GFR calc Af Amer: >60 mL/min (ref >60)
GFR calc non Af Amer: >60 mL/min (ref >60)
GLUCOSE: 89 mg/dL (ref 65–99)
POTASSIUM: 3.6 mmol/L (ref 3.5–5.1)
Sodium: 135 mmol/L (ref 135–145)

## 2015-11-09 LAB — LACTIC ACID, PLASMA: Lactic Acid, Venous: 1.5 mmol/L (ref 0.5–1.9)

## 2015-11-09 MED ORDER — HYDRALAZINE HCL 20 MG/ML IJ SOLN
10.0000 mg | INTRAMUSCULAR | Status: DC | PRN
  Administered 2015-11-10: 10 mg via INTRAVENOUS
  Filled 2015-11-09: qty 1

## 2015-11-09 MED ORDER — ZOLPIDEM TARTRATE 5 MG PO TABS
10.0000 mg | ORAL_TABLET | Freq: Every evening | ORAL | Status: DC | PRN
Start: 2015-11-09 — End: 2015-11-13
  Administered 2015-11-09 – 2015-11-11 (×2): 10 mg via ORAL
  Filled 2015-11-09 (×2): qty 2

## 2015-11-09 MED ORDER — LORAZEPAM 1 MG PO TABS: 1.0000 mg | ORAL_TABLET | Freq: Three times a day (TID) | ORAL | Status: DC | PRN

## 2015-11-09 MED ORDER — SODIUM CHLORIDE 0.9 % IV BOLUS (SEPSIS)
500.0000 mL | Freq: Once | INTRAVENOUS | Status: AC
Start: 2015-11-09 — End: 2015-11-09
  Administered 2015-11-09: 500 mL via INTRAVENOUS

## 2015-11-09 NOTE — Progress Notes (Signed)
Patient ID: Julius BowelsDonnald B Vreeland, male   DOB: 11/21/1980, 35 y.o.   MRN: [id number] 2 Days Post-Op  Subjective: Satoru is POD #2 following cystoscopy and stent insertion for a right distal stone with sepsis.   He has continued to have fever and got up to 104.8 overnight.   He has grown staph aureus in his blood and urine and the antibiotics have been changed to cefazolin following ID consultation.   He continues to have some pain but it is improving.   He has no associated signs or symptoms.  ROS:  Review of Systems  Constitutional: Positive for fever and chills.  Genitourinary: Positive for flank pain.  All other systems reviewed and are negative.   Anti-infectives: Anti-infectives    Start     Dose/Rate Route Frequency Ordered Stop   11/08/15 2200  ceFAZolin (ANCEF) IVPB 1 g/50 mL premix  Status:  Discontinued     1 g 100 mL/hr over 30 Minutes Intravenous Every 8 hours 11/08/15 1718 11/08/15 1721   11/08/15 2200  ceFAZolin (ANCEF) IVPB 2g/100 mL premix     2 g 200 mL/hr over 30 Minutes Intravenous Every 8 hours 11/08/15 1721     11/08/15 1600  ceFAZolin (ANCEF) IVPB 2g/100 mL premix     2 g 200 mL/hr over 30 Minutes Intravenous Every 8 hours 11/08/15 1214 11/08/15 1611   11/08/15 0100  piperacillin-tazobactam (ZOSYN) IVPB 3.375 g  Status:  Discontinued     3.375 g 12.5 mL/hr over 240 Minutes Intravenous Every 8 hours 11/07/15 1657 11/08/15 1203   11/07/15 1700  piperacillin-tazobactam (ZOSYN) IVPB 3.375 g     3.375 g 100 mL/hr over 30 Minutes Intravenous  Once 11/07/15 1646 11/07/15 1757      Current Facility-Administered Medications  Medication Dose Route Frequency Provider Last Rate Last Dose  . 0.9 %  sodium chloride infusion   Intravenous Continuous Michael LitterNikki Carter, MD 125 mL/hr at 11/09/15 0023 1,000 mL at 11/09/15 0023  . acetaminophen (TYLENOL) tablet 650 mg  650 mg Oral Q6H PRN Michael LitterNikki Carter, MD   650 mg at 11/09/15 0350   Or  . acetaminophen (TYLENOL) suppository 650 mg  650 mg  Rectal Q6H PRN Michael LitterNikki Carter, MD   650 mg at 11/08/15 2039  . ceFAZolin (ANCEF) IVPB 2g/100 mL premix  2 g Intravenous Q8H Emi HolesJennifer S Markle, RPH   2 g at 11/09/15 [id number]  . HYDROmorphone (DILAUDID) injection 1 mg  1 mg Intravenous Q2H PRN Kathlen ModyVijaya Akula, MD   1 mg at 11/08/15 2228  . ibuprofen (ADVIL,MOTRIN) tablet 400 mg  400 mg Oral Q4H PRN Kathlen ModyVijaya Akula, MD   400 mg at 11/09/15 0024  . ketorolac (TORADOL) 30 MG/ML injection 30 mg  30 mg Intravenous Q6H PRN Bjorn PippinJohn Taysom Glymph, MD   30 mg at 11/08/15 1658  . ondansetron (ZOFRAN) tablet 4 mg  4 mg Oral Q6H PRN Michael LitterNikki Carter, MD       Or  . ondansetron [facility](ZOFRAN) injection 4 mg  4 mg Intravenous Q6H PRN Michael LitterNikki Carter, MD   4 mg at 11/08/15 2040  . phenazopyridine (PYRIDIUM) tablet 200 mg  200 mg Oral TID WC PRN Bjorn PippinJohn Linnet Bottari, MD   200 mg at 11/08/15 2220  . sodium chloride flush (NS) 0.9 % injection 3 mL  3 mL Intravenous Q12H Michael LitterNikki Carter, MD   3 mL at 11/08/15 2100     Objective: Vital signs in last 24 hours: Temp:  [99.4 F (37.4 C)-104.8 F (40.4 C)] 99.4  F (37.4 C) (07/10 0407) Pulse Rate:  [76-110] 82 (07/10 0407) Resp:  [13-30] 21 (07/10 0407) BP: (113-158)/(74-98) 121/76 mmHg (07/10 0407) SpO2:  [93 %-100 %] 97 % (07/10 0407)  Intake/Output from previous day: 07/09 0701 - 07/10 0700 In: 3959.3 [I.V.:3609.3; IV Piggyback:350] Out: 2250 [Urine:2250] Intake/Output this shift: Total I/O In: 2434.3 [I.V.:2234.3; IV Piggyback:200] Out: 1250 [Urine:1250]   Physical Exam  Constitutional: He is well-developed, well-nourished, and in no distress.  Cardiovascular:  Sinus tach  Pulmonary/Chest: Effort normal. No respiratory distress.  Abdominal: Soft. There is tenderness (right upper and lower quadrant). There is guarding.    Lab Results:   Recent Labs  11/08/15 0336 11/08/15 2243  WBC 21.3* 18.5*  HGB 11.6* 14.7  HCT 34.4* 42.8  PLT 193 174   BMET  Recent Labs  11/08/15 0336 11/08/15 2243  NA 138 133*  K 3.5 3.4*  CL 107 101   CO2 27 21*  GLUCOSE 111* 112*  BUN 12 9  CREATININE 0.89 0.87  CALCIUM 8.4* 8.4*   PT/INR  Recent Labs  11/07/15 2150  LABPROT 15.2  INR 1.18   ABG No results for input(s): PHART, HCO3 in the last 72 hours.  Invalid input(s): PCO2, PO2  Studies/Results: Ct Abdomen Pelvis W Contrast  11/07/2015  CLINICAL DATA:  Right lower quadrant pain for 2 days. Vomiting and diarrhea. EXAM: CT ABDOMEN AND PELVIS WITH CONTRAST TECHNIQUE: Multidetector CT imaging of the abdomen and pelvis was performed using the standard protocol following bolus administration of intravenous contrast. CONTRAST:  1 ISOVUE-300 IOPAMIDOL (ISOVUE-300) INJECTION 61% COMPARISON:  Multiple CT scans since Sep 01, 2003 FINDINGS: A few thin walled lung cysts are seen in the right lung base with no solid components. No suspicious nodules or masses. The lung bases are otherwise normal. No free air or free fluid. Bilateral renal stones are identified with the largest seen on the left measuring 6.7 mm on series 5, image 91. There is very mild stranding adjacent to the right kidney in addition to moderate hydronephrosis, not seen previously. The right ureter is prominent in caliber along its length with a 3.5 mm stone in the distal right ureter just above the UVJ. There is mild thickening of the ureteral wall at the level of the right distal ureteral stone, consistent with inflammation. Additionally, there is an ill-defined region of low attenuation in the anterior right kidney as seen on series 2, image 31 with no definitive focal adjacent fat stranding. This low-attenuation measures 2.6 by 1.4 cm. A few tiny low-attenuation lesions in both kidneys are likely cysts but too small to characterize. No other suspicious renal abnormalities. There is hepatic steatosis. The liver, gallbladder, portal vein, spleen, adrenal glands, and pancreas are within normal limits. The abdominal aorta is normal in appearance. No adenopathy. The stomach and small  bowel are normal. The colon and visualized appendix are normal. The pelvis demonstrates a distal right ureteral stone as described above. No adenopathy or mass identified the pelvis. The prostate, seminal vesicles, and bladder are normal in appearance. The visualized bones are unremarkable. Delayed images demonstrate no filling defects in the upper renal collecting systems bilaterally. There is appropriate excretion into the right renal collecting system. IMPRESSION: 1. Obstructing stone in the distal right ureter measuring 3.5 mm with moderate hydronephrosis and mild perinephric stranding. The region of the ill defined low-attenuation in the anterior right kidney could represent developing focal pyelonephritis. No discrete abscess seen on this study. Recommend clinical correlation and follow-up. 2.  Multiple other bilateral renal stones. Electronically Signed   By: Gerome Sam III M.D   On: 11/07/2015 18:38   Dg Cystogram  11/07/2015  CLINICAL DATA:  Acute pyelonephritis EXAM: CYSTOGRAM TECHNIQUE: Cystoscopy was performed by the urologist. Spot intraoperative fluoroscopic images were provided. FLUOROSCOPY TIME:  9 seconds COMPARISON:  CT abdomen pelvis dated 11/07/2015 FINDINGS: Frontal and lateral spot fluoroscopic images have been provided during the procedure. Contrast is present in the bladder. A right ureteral stent is incompletely visualized. IMPRESSION: Intraoperative fluoroscopic images during cystoscopy, as described above. Electronically Signed   By: Charline Bills M.D.   On: 11/07/2015 21:35   Dg Chest Port 1 View  11/07/2015  CLINICAL DATA:  Acute onset of sepsis.  Initial encounter. EXAM: PORTABLE CHEST 1 VIEW COMPARISON:  Thoracic spine radiographs performed 03/21/2009 FINDINGS: The lungs are well-aerated. Mild vascular congestion is suggested. There is no evidence of focal opacification, pleural effusion or pneumothorax. The cardiomediastinal silhouette is borderline normal in size. No  acute osseous abnormalities are seen. IMPRESSION: Mild vascular congestion suggested.  Lungs remain grossly clear. Electronically Signed   By: Roanna Raider M.D.   On: 11/07/2015 22:47   Labs reviewed.  ID consult reviewed.   Assessment and Plan: Staph sepsis with obstructing right distal ureteral stone s/p right ureteral stenting.   Continue current therapy.  He will need ureteroscopy in 1-2 weeks following recovery from the infection.  My office will arrange the f/u.        LOS: 2 days    Anner Crete 11/09/2015 [phone]

## 2015-11-09 NOTE — Progress Notes (Signed)
  Echocardiogram 2D Echocardiogram has been performed.  Tye SavoyCasey N Rakwon Letourneau 11/09/2015, 5:00 PM

## 2015-11-09 NOTE — Progress Notes (Signed)
Patient ID: James Marshall, male   DOB: 12/28/1980, 35 y.o.   MRN: [id number]         [facility]    Date of Admission:  11/07/2015    Total days of antibiotics 2         Principal Problem:   Staphylococcus aureus bacteremia Active Problems:   Sepsis (HCC)   Acute pyelonephritis   Right ureteral stone   Obstructive uropathy   Normocytic anemia   .  ceFAZolin (ANCEF) IV  2 g Intravenous Q8H  . sodium chloride flush  3 mL Intravenous Q12H    SUBJECTIVE: His right lower quadrant and flank pain are under better control but he remains fairly miserable due to his high fevers.  Review of Systems: Review of Systems  Constitutional: Positive for fever, chills, malaise/fatigue and diaphoresis.  Respiratory: Positive for cough. Negative for sputum production and shortness of breath.   Cardiovascular: Negative for chest pain.  Gastrointestinal: Positive for abdominal pain. Negative for nausea, vomiting and diarrhea.  Musculoskeletal: Negative for joint pain.  Skin: Negative for rash.  Neurological: Negative for headaches.    Past Medical History  Diagnosis Date  . Acute pyelonephritis     Social History  Substance Use Topics  . Smoking status: Former Games developermoker  . Smokeless tobacco: None  . Alcohol Use: Yes     Comment: 2-3 times per week, no history of EtOH withdrawal    Family History  Problem Relation Age of Onset  . Cervical cancer Mother   . Diabetes Paternal Grandfather   . Prostate cancer Paternal Grandfather   . Heart disease Maternal Grandfather    Allergies  Allergen Reactions  . Hydrocodone Nausea And Vomiting    OBJECTIVE: Filed Vitals:   11/09/15 0900 11/09/15 1000 11/09/15 1100 11/09/15 1229  BP:  169/103 151/93 161/102  Pulse:  89 86 93  Temp: 101.1 F (38.4 C) 100 F (37.8 C) 99.2 F (37.3 C) 99.3 F (37.4 C)  TempSrc: Rectal Axillary Axillary Other (Comment)  Resp:  22 9 28   Height:      Weight:      SpO2:  96% 98%  100%   Body mass index is 25.11 kg/(m^2).  Physical Exam  Constitutional: He is oriented to person, place, and time.  He is alert and in no distress. He is on a cooling blanket.  Eyes: Conjunctivae are normal.  Cardiovascular: Normal rate and regular rhythm.   No murmur heard. Pulmonary/Chest: Effort normal and breath sounds normal.  Abdominal: Soft. There is tenderness.  Mild right lower quadrant tenderness with palpation.  Musculoskeletal: Normal range of motion. He exhibits no edema or tenderness.  Neurological: He is alert and oriented to person, place, and time.  Skin: No rash noted.  Psychiatric: Mood and affect normal.    Lab Results Lab Results  Component Value Date   WBC 16.0* 11/09/2015   HGB 13.8 11/09/2015   HCT 40.3 11/09/2015   MCV 92.4 11/09/2015   PLT 150 11/09/2015    Lab Results  Component Value Date   CREATININE 0.76 11/09/2015   BUN 6 11/09/2015   NA 135 11/09/2015   K 3.6 11/09/2015   CL 104 11/09/2015   CO2 24 11/09/2015    Lab Results  Component Value Date   ALT 21 11/07/2015   AST 28 11/07/2015   ALKPHOS 64 11/07/2015   BILITOT 0.8 11/07/2015     Microbiology: Recent Results (from the past 240 hour(s))  Urine  culture     Status: Abnormal (Preliminary result)   Collection Time: 11/07/15  1:14 PM  Result Value Ref Range Status   Specimen Description URINE, RANDOM  Final   Special Requests NONE  Final   Culture >=100,000 COLONIES/mL STAPHYLOCOCCUS AUREUS (A)  Final   Report Status PENDING  Incomplete  Blood Culture (routine x 2)     Status: Abnormal (Preliminary result)   Collection Time: 11/07/15  5:15 PM  Result Value Ref Range Status   Specimen Description BLOOD RIGHT ANTECUBITAL  Final   Special Requests BOTTLES DRAWN AEROBIC AND ANAEROBIC 5CC  Final   Culture  Setup Time   Final    GRAM POSITIVE COCCI IN CLUSTERS IN BOTH AEROBIC AND ANAEROBIC BOTTLES CRITICAL RESULT CALLED TO, READ BACK BY AND VERIFIED WITH: J MARKEL 11/08/15 @  0935 M VESTAL    Culture STAPHYLOCOCCUS AUREUS (A)  Final   Report Status PENDING  Incomplete  Blood Culture ID Panel (Reflexed)     Status: Abnormal   Collection Time: 11/07/15  5:15 PM  Result Value Ref Range Status   Enterococcus species NOT DETECTED NOT DETECTED Final   Vancomycin resistance NOT DETECTED NOT DETECTED Final   Listeria monocytogenes NOT DETECTED NOT DETECTED Final   Staphylococcus species DETECTED (A) NOT DETECTED Final    Comment: CRITICAL RESULT CALLED TO, READ BACK BY AND VERIFIED WITH: J MARKEL 11/08/15 @ 0935 M VESTAL    Staphylococcus aureus DETECTED (A) NOT DETECTED Final    Comment: CRITICAL RESULT CALLED TO, READ BACK BY AND VERIFIED WITH: J MARKEL 11/08/15 @ 0935 M VESTAL    Methicillin resistance NOT DETECTED NOT DETECTED Final   Streptococcus species NOT DETECTED NOT DETECTED Final   Streptococcus agalactiae NOT DETECTED NOT DETECTED Final   Streptococcus pneumoniae NOT DETECTED NOT DETECTED Final   Streptococcus pyogenes NOT DETECTED NOT DETECTED Final   Acinetobacter baumannii NOT DETECTED NOT DETECTED Final   Enterobacteriaceae species NOT DETECTED NOT DETECTED Final   Enterobacter cloacae complex NOT DETECTED NOT DETECTED Final   Escherichia coli NOT DETECTED NOT DETECTED Final   Klebsiella oxytoca NOT DETECTED NOT DETECTED Final   Klebsiella pneumoniae NOT DETECTED NOT DETECTED Final   Proteus species NOT DETECTED NOT DETECTED Final   Serratia marcescens NOT DETECTED NOT DETECTED Final   Carbapenem resistance NOT DETECTED NOT DETECTED Final   Haemophilus influenzae NOT DETECTED NOT DETECTED Final   Neisseria meningitidis NOT DETECTED NOT DETECTED Final   Pseudomonas aeruginosa NOT DETECTED NOT DETECTED Final   Candida albicans NOT DETECTED NOT DETECTED Final   Candida glabrata NOT DETECTED NOT DETECTED Final   Candida krusei NOT DETECTED NOT DETECTED Final   Candida parapsilosis NOT DETECTED NOT DETECTED Final   Candida tropicalis NOT DETECTED  NOT DETECTED Final  Blood Culture (routine x 2)     Status: Abnormal (Preliminary result)   Collection Time: 11/07/15  5:24 PM  Result Value Ref Range Status   Specimen Description BLOOD RIGHT HAND  Final   Special Requests BOTTLES DRAWN AEROBIC AND ANAEROBIC 5CC  Final   Culture  Setup Time   Final    GRAM POSITIVE COCCI IN CLUSTERS IN BOTH AEROBIC AND ANAEROBIC BOTTLES CRITICAL RESULT CALLED TO, READ BACK BY AND VERIFIED WITH: J MARKEL 11/08/15 @ 0935 M VESTAL    Culture (A)  Final    STAPHYLOCOCCUS AUREUS SUSCEPTIBILITIES TO FOLLOW    Report Status PENDING  Incomplete  MRSA PCR Screening     Status: None  Collection Time: 11/07/15 10:38 PM  Result Value Ref Range Status   MRSA by PCR NEGATIVE NEGATIVE Final    Comment:        The GeneXpert MRSA Assay (FDA approved for NASAL specimens only), is one component of a comprehensive MRSA colonization surveillance program. It is not intended to diagnose MRSA infection nor to guide or monitor treatment for MRSA infections.      ASSESSMENT: He continues to spike high fevers only 36 hours into antibiotic therapy for MSSA bacteremia and right pyelonephritis. Repeat blood cultures were obtained this morning and transthoracic echocardiogram is pending. If he continues to spike fevers over the next 48 hours would consider repeating renal imaging to see if the area of low attenuation is evolving into a large abscess that might need drainage.  PLAN: 1. Continue cefazolin 2. Await results of repeat blood cultures and TTE  Cliffton Asters, MD [facility] [phone] pager   [phone] cell 11/09/2015, 12:32 PM

## 2015-11-09 NOTE — Progress Notes (Signed)
PROGRESS NOTE    James BowelsDonnald B Henrichs  ZOX:[id number] DOB: 04/15/1981 DOA: 11/07/2015 PCP: No primary care provider on file.    Brief Narrative: James Marshall is a 35 y.o. gentleman with a history of prior pyelonephritis who presents to the ED  RLQ abodminal pain with fever, nausea, and vomiting. Found to have sepsis from pyelonephritis from right renal stone.   Assessment & Plan:   Principal Problem:   Staphylococcus aureus bacteremia Active Problems:   Right ureteral stone   Sepsis (HCC)   Obstructive uropathy   Acute pyelonephritis   Normocytic anemia   sepsis from pyelonephritis from right ureteral stone: Admitted to step down for IV antibiotics , urology consulted and underwent cystoscopy with insertion of the right double j stent.  IV fluids, pain control .  Plan for ureteroscopy for stone removal in 7-10 days by urology.  Sepsis improving. But fever curve still high. Use cooling blanket as needed.  Lactic acid normalized.  Monitor wbc count.  Notified by lab that Blood cultures show MSSA, changed IV zosyn to IV ancef.  Repeat blood cultures ordered.  Echocardiogram ordered BY ID.  Appreciate ID recommendations.  Leukocytosis improving.    Normocytic anemia: Monitor.  Hypokalemia: replete and rpeat level is normal.        DVT prophylaxis: (Lovenox/ Code Status: (Full code Family Communication: multiple family members at bedside.  Disposition Plan: pending further management.    Consultants:   Urology.    Procedures:  Cystoscopy with insertion of right double-J stent placement on 7/8  Antimicrobials: IV ZOSYN FROM 7/8 to 7/9 IV ancef FROM 7/9/   Subjective: Pain improving.   Objective: Filed Vitals:   11/09/15 0900 11/09/15 1000 11/09/15 1100 11/09/15 1229  BP:  169/103 151/93 161/102  Pulse:  89 86 93  Temp: 101.1 F (38.4 C) 100 F (37.8 C) 99.2 F (37.3 C) 99.3 F (37.4 C)  TempSrc: Rectal Axillary Axillary Other (Comment)  Resp:  22 9 28    Height:      Weight:      SpO2:  96% 98% 100%    Intake/Output Summary (Last 24 hours) at 11/09/15 1359 Last data filed at 11/09/15 1000  Gross per 24 hour  Intake 3159.25 ml  Output   2525 ml  Net 634.25 ml   Filed Weights   11/07/15 1308  Weight: 79.379 kg (175 lb)    Examination:  General exam: Appears anxious but not in any disress.  Respiratory system: Clear to auscultation. Respiratory effort normal. Cardiovascular system: S1 & S2 heard, RRR. No JVD, murmurs, rubs, gallops or clicks. No pedal edema. Gastrointestinal system: right upper quadrnat and flank pain on the right present, with some guarding, no signs of peritonitis. Non distended abdomen and normal bowel sounds.  Central nervous system: Alert and oriented. No focal neurological deficits. Extremities: Symmetric 5 x 5 power. Skin: No rashes, lesions or ulcers Psychiatry: Judgement and insight appear normal. Mood & affect appropriate.     Data Reviewed: I have personally reviewed following labs and imaging studies  CBC:  Recent Labs Lab 11/07/15 1315 11/08/15 0336 11/08/15 2243 11/09/15 1105  WBC 11.5* 21.3* 18.5* 16.0*  HGB 14.8 11.6* 14.7 13.8  HCT 41.8 34.4* 42.8 40.3  MCV 91.3 94.0 93.4 92.4  PLT 307 193 174 150   Basic Metabolic Panel:  Recent Labs Lab 11/07/15 1315 11/08/15 0336 11/08/15 2243 11/09/15 1105  NA 139 138 133* 135  K 3.5 3.5 3.4* 3.6  CL 106 107  101 104  CO2 23 27 21* 24  GLUCOSE 141* 111* 112* 89  BUN 13 12 9 6   CREATININE 0.79 0.89 0.87 0.76  CALCIUM 9.5 8.4* 8.4* 8.4*   GFR: Estimated Creatinine Clearance: 131.8 mL/min (by C-G formula based on Cr of 0.76). Liver Function Tests:  Recent Labs Lab 11/07/15 1315  AST 28  ALT 21  ALKPHOS 64  BILITOT 0.8  PROT 7.3  ALBUMIN 4.3    Recent Labs Lab 11/07/15 1315  LIPASE 25   No results for input(s): AMMONIA in the last 168 hours. Coagulation Profile:  Recent Labs Lab 11/07/15 2150  INR 1.18   Cardiac  Enzymes: No results for input(s): CKTOTAL, CKMB, CKMBINDEX, TROPONINI in the last 168 hours. BNP (last 3 results) No results for input(s): PROBNP in the last 8760 hours. HbA1C: No results for input(s): HGBA1C in the last 72 hours. CBG: No results for input(s): GLUCAP in the last 168 hours. Lipid Profile: No results for input(s): CHOL, HDL, LDLCALC, TRIG, CHOLHDL, LDLDIRECT in the last 72 hours. Thyroid Function Tests: No results for input(s): TSH, T4TOTAL, FREET4, T3FREE, THYROIDAB in the last 72 hours. Anemia Panel: No results for input(s): VITAMINB12, FOLATE, FERRITIN, TIBC, IRON, RETICCTPCT in the last 72 hours. Sepsis Labs:  Recent Labs Lab 11/07/15 2150 11/07/15 2158 11/08/15 0017 11/08/15 2243 11/09/15 0211  PROCALCITON 1.17  --   --   --   --   LATICACIDVEN  --  1.2 0.9 2.0* 1.5    Recent Results (from the past 240 hour(s))  Urine culture     Status: Abnormal   Collection Time: 11/07/15  1:14 PM  Result Value Ref Range Status   Specimen Description URINE, RANDOM  Final   Special Requests NONE  Final   Culture >=100,000 COLONIES/mL STAPHYLOCOCCUS AUREUS (A)  Final   Report Status 11/09/2015 FINAL  Final   Organism ID, Bacteria STAPHYLOCOCCUS AUREUS (A)  Final      Susceptibility   Staphylococcus aureus - MIC*    CIPROFLOXACIN <=0.5 SENSITIVE Sensitive     GENTAMICIN <=0.5 SENSITIVE Sensitive     NITROFURANTOIN <=16 SENSITIVE Sensitive     OXACILLIN <=0.25 SENSITIVE Sensitive     TETRACYCLINE >=16 RESISTANT Resistant     VANCOMYCIN <=0.5 SENSITIVE Sensitive     TRIMETH/SULFA <=10 SENSITIVE Sensitive     CLINDAMYCIN <=0.25 SENSITIVE Sensitive     RIFAMPIN <=0.5 SENSITIVE Sensitive     Inducible Clindamycin NEGATIVE Sensitive     * >=100,000 COLONIES/mL STAPHYLOCOCCUS AUREUS  Blood Culture (routine x 2)     Status: Abnormal (Preliminary result)   Collection Time: 11/07/15  5:15 PM  Result Value Ref Range Status   Specimen Description BLOOD RIGHT ANTECUBITAL   Final   Special Requests BOTTLES DRAWN AEROBIC AND ANAEROBIC 5CC  Final   Culture  Setup Time   Final    GRAM POSITIVE COCCI IN CLUSTERS IN BOTH AEROBIC AND ANAEROBIC BOTTLES CRITICAL RESULT CALLED TO, READ BACK BY AND VERIFIED WITH: J MARKEL 11/08/15 @ 0935 M VESTAL    Culture STAPHYLOCOCCUS AUREUS (A)  Final   Report Status PENDING  Incomplete  Blood Culture ID Panel (Reflexed)     Status: Abnormal   Collection Time: 11/07/15  5:15 PM  Result Value Ref Range Status   Enterococcus species NOT DETECTED NOT DETECTED Final   Vancomycin resistance NOT DETECTED NOT DETECTED Final   Listeria monocytogenes NOT DETECTED NOT DETECTED Final   Staphylococcus species DETECTED (A) NOT DETECTED Final  Comment: CRITICAL RESULT CALLED TO, READ BACK BY AND VERIFIED WITH: J MARKEL 11/08/15 @ 0935 M VESTAL    Staphylococcus aureus DETECTED (A) NOT DETECTED Final    Comment: CRITICAL RESULT CALLED TO, READ BACK BY AND VERIFIED WITH: J MARKEL 11/08/15 @ 0935 M VESTAL    Methicillin resistance NOT DETECTED NOT DETECTED Final   Streptococcus species NOT DETECTED NOT DETECTED Final   Streptococcus agalactiae NOT DETECTED NOT DETECTED Final   Streptococcus pneumoniae NOT DETECTED NOT DETECTED Final   Streptococcus pyogenes NOT DETECTED NOT DETECTED Final   Acinetobacter baumannii NOT DETECTED NOT DETECTED Final   Enterobacteriaceae species NOT DETECTED NOT DETECTED Final   Enterobacter cloacae complex NOT DETECTED NOT DETECTED Final   Escherichia coli NOT DETECTED NOT DETECTED Final   Klebsiella oxytoca NOT DETECTED NOT DETECTED Final   Klebsiella pneumoniae NOT DETECTED NOT DETECTED Final   Proteus species NOT DETECTED NOT DETECTED Final   Serratia marcescens NOT DETECTED NOT DETECTED Final   Carbapenem resistance NOT DETECTED NOT DETECTED Final   Haemophilus influenzae NOT DETECTED NOT DETECTED Final   Neisseria meningitidis NOT DETECTED NOT DETECTED Final   Pseudomonas aeruginosa NOT DETECTED NOT  DETECTED Final   Candida albicans NOT DETECTED NOT DETECTED Final   Candida glabrata NOT DETECTED NOT DETECTED Final   Candida krusei NOT DETECTED NOT DETECTED Final   Candida parapsilosis NOT DETECTED NOT DETECTED Final   Candida tropicalis NOT DETECTED NOT DETECTED Final  Blood Culture (routine x 2)     Status: Abnormal (Preliminary result)   Collection Time: 11/07/15  5:24 PM  Result Value Ref Range Status   Specimen Description BLOOD RIGHT HAND  Final   Special Requests BOTTLES DRAWN AEROBIC AND ANAEROBIC 5CC  Final   Culture  Setup Time   Final    GRAM POSITIVE COCCI IN CLUSTERS IN BOTH AEROBIC AND ANAEROBIC BOTTLES CRITICAL RESULT CALLED TO, READ BACK BY AND VERIFIED WITH: J MARKEL 11/08/15 @ 0935 M VESTAL    Culture (A)  Final    STAPHYLOCOCCUS AUREUS SUSCEPTIBILITIES TO FOLLOW    Report Status PENDING  Incomplete  MRSA PCR Screening     Status: None   Collection Time: 11/07/15 10:38 PM  Result Value Ref Range Status   MRSA by PCR NEGATIVE NEGATIVE Final    Comment:        The GeneXpert MRSA Assay (FDA approved for NASAL specimens only), is one component of a comprehensive MRSA colonization surveillance program. It is not intended to diagnose MRSA infection nor to guide or monitor treatment for MRSA infections.          Radiology Studies: Ct Abdomen Pelvis W Contrast  11/07/2015  CLINICAL DATA:  Right lower quadrant pain for 2 days. Vomiting and diarrhea. EXAM: CT ABDOMEN AND PELVIS WITH CONTRAST TECHNIQUE: Multidetector CT imaging of the abdomen and pelvis was performed using the standard protocol following bolus administration of intravenous contrast. CONTRAST:  1 ISOVUE-300 IOPAMIDOL (ISOVUE-300) INJECTION 61% COMPARISON:  Multiple CT scans since Sep 01, 2003 FINDINGS: A few thin walled lung cysts are seen in the right lung base with no solid components. No suspicious nodules or masses. The lung bases are otherwise normal. No free air or free fluid. Bilateral renal  stones are identified with the largest seen on the left measuring 6.7 mm on series 5, image 91. There is very mild stranding adjacent to the right kidney in addition to moderate hydronephrosis, not seen previously. The right ureter is prominent in caliber along  its length with a 3.5 mm stone in the distal right ureter just above the UVJ. There is mild thickening of the ureteral wall at the level of the right distal ureteral stone, consistent with inflammation. Additionally, there is an ill-defined region of low attenuation in the anterior right kidney as seen on series 2, image 31 with no definitive focal adjacent fat stranding. This low-attenuation measures 2.6 by 1.4 cm. A few tiny low-attenuation lesions in both kidneys are likely cysts but too small to characterize. No other suspicious renal abnormalities. There is hepatic steatosis. The liver, gallbladder, portal vein, spleen, adrenal glands, and pancreas are within normal limits. The abdominal aorta is normal in appearance. No adenopathy. The stomach and small bowel are normal. The colon and visualized appendix are normal. The pelvis demonstrates a distal right ureteral stone as described above. No adenopathy or mass identified the pelvis. The prostate, seminal vesicles, and bladder are normal in appearance. The visualized bones are unremarkable. Delayed images demonstrate no filling defects in the upper renal collecting systems bilaterally. There is appropriate excretion into the right renal collecting system. IMPRESSION: 1. Obstructing stone in the distal right ureter measuring 3.5 mm with moderate hydronephrosis and mild perinephric stranding. The region of the ill defined low-attenuation in the anterior right kidney could represent developing focal pyelonephritis. No discrete abscess seen on this study. Recommend clinical correlation and follow-up. 2. Multiple other bilateral renal stones. Electronically Signed   By: Gerome Sam III M.D   On:  11/07/2015 18:38   Dg Chest Port 1 View  11/07/2015  CLINICAL DATA:  Acute onset of sepsis.  Initial encounter. EXAM: PORTABLE CHEST 1 VIEW COMPARISON:  Thoracic spine radiographs performed 03/21/2009 FINDINGS: The lungs are well-aerated. Mild vascular congestion is suggested. There is no evidence of focal opacification, pleural effusion or pneumothorax. The cardiomediastinal silhouette is borderline normal in size. No acute osseous abnormalities are seen. IMPRESSION: Mild vascular congestion suggested.  Lungs remain grossly clear. Electronically Signed   By: Roanna Raider M.D.   On: 11/07/2015 22:47        Scheduled Meds: .  ceFAZolin (ANCEF) IV  2 g Intravenous Q8H  . sodium chloride flush  3 mL Intravenous Q12H   Continuous Infusions: . sodium chloride 125 mL/hr at 11/09/15 0840     LOS: 2 days    Time spent: 30 minutes.     Kathlen Mody, MD [facility] Pager [phone]  If 7PM-7AM, please contact night-coverage [email or web address] Password [email or web address] 11/09/2015, 1:59 PM

## 2015-11-09 NOTE — Progress Notes (Signed)
RN asked Pt was he ready for a bath. Pt replied to RN and stated that his girlfriend will assist with bath. Tech supplied Pt with bath supplies.

## 2015-11-10 ENCOUNTER — Inpatient Hospital Stay (HOSPITAL_COMMUNITY): Payer: BC Managed Care – PPO

## 2015-11-10 DIAGNOSIS — A419 Sepsis, unspecified organism: Secondary | ICD-10-CM

## 2015-11-10 LAB — BASIC METABOLIC PANEL
ANION GAP: 11 (ref 5–15)
BUN: 6 mg/dL (ref 6–20)
CHLORIDE: 104 mmol/L (ref 101–111)
CO2: 20 mmol/L — AB (ref 22–32)
Calcium: 8.2 mg/dL — ABNORMAL LOW (ref 8.9–10.3)
Creatinine, Ser: 0.79 mg/dL (ref 0.61–1.24)
GFR calc Af Amer: >60 mL/min (ref >60)
GLUCOSE: 83 mg/dL (ref 65–99)
POTASSIUM: 3.1 mmol/L — AB (ref 3.5–5.1)
Sodium: 135 mmol/L (ref 135–145)

## 2015-11-10 LAB — CULTURE, BLOOD (ROUTINE X 2)

## 2015-11-10 LAB — MAGNESIUM: Magnesium: 1.5 mg/dL — ABNORMAL LOW (ref 1.7–2.4)

## 2015-11-10 MED ORDER — ACETAMINOPHEN 650 MG RE SUPP: 650.0000 mg | Freq: Four times a day (QID) | RECTAL | Status: DC | PRN

## 2015-11-10 MED ORDER — POTASSIUM CHLORIDE CRYS ER 20 MEQ PO TBCR
40.0000 meq | EXTENDED_RELEASE_TABLET | Freq: Two times a day (BID) | ORAL | Status: AC
Start: 2015-11-10 — End: 2015-11-10
  Administered 2015-11-10 (×2): 40 meq via ORAL
  Filled 2015-11-10 (×2): qty 2

## 2015-11-10 MED ORDER — ACETAMINOPHEN 325 MG PO TABS
650.0000 mg | ORAL_TABLET | ORAL | Status: DC | PRN
  Administered 2015-11-10 – 2015-11-12 (×5): 650 mg via ORAL
  Filled 2015-11-10 (×5): qty 2

## 2015-11-10 NOTE — Progress Notes (Signed)
Patient ID: James Marshall, James Marshall   DOB: 03/23/1981, 35 y.o.   MRN: [id number] 3 Days Post-Op  Subjective: James Marshall is doing better with a declining fever curve but he still had a Tmax of 102 overnight.   He has reduced pain.   He denies nausea.   ECHO was negative.   Repeat cultures pending.  ROS:  Review of Systems  Constitutional: Positive for fever and chills.  Gastrointestinal: Positive for abdominal pain.  All other systems reviewed and are negative.   Anti-infectives: Anti-infectives    Start     Dose/Rate Route Frequency Ordered Stop   11/08/15 2200  ceFAZolin (ANCEF) IVPB 1 g/50 mL premix  Status:  Discontinued     1 g 100 mL/hr over 30 Minutes Intravenous Every 8 hours 11/08/15 1718 11/08/15 1721   11/08/15 2200  ceFAZolin (ANCEF) IVPB 2g/100 mL premix     2 g 200 mL/hr over 30 Minutes Intravenous Every 8 hours 11/08/15 1721     11/08/15 1600  ceFAZolin (ANCEF) IVPB 2g/100 mL premix     2 g 200 mL/hr over 30 Minutes Intravenous Every 8 hours 11/08/15 1214 11/08/15 1611   11/08/15 0100  piperacillin-tazobactam (ZOSYN) IVPB 3.375 g  Status:  Discontinued     3.375 g 12.5 mL/hr over 240 Minutes Intravenous Every 8 hours 11/07/15 1657 11/08/15 1203   11/07/15 1700  piperacillin-tazobactam (ZOSYN) IVPB 3.375 g     3.375 g 100 mL/hr over 30 Minutes Intravenous  Once 11/07/15 1646 11/07/15 1757      Current Facility-Administered Medications  Medication Dose Route Frequency Provider Last Rate Last Dose  . 0.9 %  sodium chloride infusion   Intravenous Continuous Michael LitterNikki Carter, MD 125 mL/hr at 11/10/15 0556    . acetaminophen (TYLENOL) tablet 650 mg  650 mg Oral Q6H PRN Michael LitterNikki Carter, MD   650 mg at 11/10/15 0252   Or  . acetaminophen (TYLENOL) suppository 650 mg  650 mg Rectal Q6H PRN Michael LitterNikki Carter, MD   650 mg at 11/08/15 2039  . ceFAZolin (ANCEF) IVPB 2g/100 mL premix  2 g Intravenous Q8H Emi HolesJennifer S Markle, RPH   2 g at 11/10/15 0555  . hydrALAZINE (APRESOLINE) injection 10 mg  10  mg Intravenous Q4H PRN Kathlen ModyVijaya Akula, MD      . HYDROmorphone (DILAUDID) injection 1 mg  1 mg Intravenous Q2H PRN Kathlen ModyVijaya Akula, MD   1 mg at 11/10/15 0608  . ibuprofen (ADVIL,MOTRIN) tablet 400 mg  400 mg Oral Q4H PRN Kathlen ModyVijaya Akula, MD   400 mg at 11/10/15 0053  . ketorolac (TORADOL) 30 MG/ML injection 30 mg  30 mg Intravenous Q6H PRN Bjorn PippinJohn Nikolaus Pienta, MD   30 mg at 11/08/15 1658  . LORazepam (ATIVAN) tablet 1 mg  1 mg Oral Q8H PRN Kathlen ModyVijaya Akula, MD      . ondansetron (ZOFRAN) tablet 4 mg  4 mg Oral Q6H PRN Michael LitterNikki Carter, MD       Or  . ondansetron [facility](ZOFRAN) injection 4 mg  4 mg Intravenous Q6H PRN Michael LitterNikki Carter, MD   4 mg at 11/09/15 0300  . phenazopyridine (PYRIDIUM) tablet 200 mg  200 mg Oral TID WC PRN Bjorn PippinJohn Jack Bolio, MD   200 mg at 11/08/15 2220  . sodium chloride flush (NS) 0.9 % injection 3 mL  3 mL Intravenous Q12H Michael LitterNikki Carter, MD   3 mL at 11/09/15 0955  . zolpidem (AMBIEN) tablet 10 mg  10 mg Oral QHS PRN Kathlen ModyVijaya Akula, MD   10  mg at 11/09/15 2140     Objective: Vital signs in last 24 hours: Temp:  [97.7 F (36.5 C)-102 F (38.9 C)] 99 F (37.2 C) (07/11 0601) Pulse Rate:  [86-119] 102 (07/11 0601) Resp:  [9-28] 16 (07/11 0601) BP: (135-177)/(90-111) 153/94 mmHg (07/11 0601) SpO2:  [93 %-100 %] 96 % (07/11 0601) Weight:  [78.3 kg (172 lb 9.9 oz)] 78.3 kg (172 lb 9.9 oz) (07/10 1733)  Intake/Output from previous day: 07/10 0701 - 07/11 0700 In: 240 [P.O.:240] Out: 1450 [Urine:1450] Intake/Output this shift: Total I/O In: -  Out: 325 [Urine:325]   Physical Exam  Constitutional: He is oriented to person, place, and time and well-developed, well-nourished, and in no distress.  Cardiovascular: Normal rate and regular rhythm.   Pulmonary/Chest: Effort normal. No respiratory distress.  Abdominal: There is tenderness (the tenderness is reduced.   He has some bilateral tenderness but that may be from the impact of the rigors on his abdominal muscles. ). There is guarding.  Musculoskeletal:  Normal range of motion. He exhibits no edema or tenderness.  Neurological: He is alert and oriented to person, place, and time.  Skin: Skin is warm and dry.  Psychiatric: Mood and affect normal.  Vitals reviewed.   Lab Results:   Recent Labs  11/08/15 2243 11/09/15 1105  WBC 18.5* 16.0*  HGB 14.7 13.8  HCT 42.8 40.3  PLT 174 150   BMET  Recent Labs  11/08/15 2243 11/09/15 1105  NA 133* 135  K 3.4* 3.6  CL 101 104  CO2 21* 24  GLUCOSE 112* 89  BUN 9 6  CREATININE 0.87 0.76  CALCIUM 8.4* 8.4*   PT/INR  Recent Labs  11/07/15 2150  LABPROT 15.2  INR 1.18   ABG No results for input(s): PHART, HCO3 in the last 72 hours.  Invalid input(s): PCO2, PO2  Studies/Results: No results found.  Recent notes reviewed.   Repeat cultures are pending.   ECHO reviewed.   Assessment and Plan: Right ureteral stone with staph sepsis showing gradual improvement post stenting on cefazolin.   I concur that if the fever doesn't continue to improve that repeat renal imaging to assess for possible abscess formation would be prudent.        LOS: 3 days    Anner Crete 11/10/2015 [phone]

## 2015-11-10 NOTE — Progress Notes (Signed)
Patient ID: James Marshall, male   DOB: 07/04/1980, 35 y.o.   MRN: [id number]         [facility]    Date of Admission:  11/07/2015           Day 3 antibiotics  Principal Problem:   Staphylococcus aureus bacteremia Active Problems:   Sepsis (HCC)   Acute pyelonephritis   Right ureteral stone   Obstructive uropathy   Normocytic anemia   .  ceFAZolin (ANCEF) IV  2 g Intravenous Q8H  . potassium chloride  40 mEq Oral BID  . sodium chloride flush  3 mL Intravenous Q12H    SUBJECTIVE: He is feeling much better today.  Review of Systems: Review of Systems  Constitutional: Positive for fever, chills and malaise/fatigue. Negative for diaphoresis.  Gastrointestinal: Positive for abdominal pain. Negative for nausea, vomiting and diarrhea.  Genitourinary: Positive for flank pain.  Neurological: Positive for headaches.    Past Medical History  Diagnosis Date  . Acute pyelonephritis     Social History  Substance Use Topics  . Smoking status: Former Games developermoker  . Smokeless tobacco: None  . Alcohol Use: Yes     Comment: 2-3 times per week, no history of EtOH withdrawal    Family History  Problem Relation Age of Onset  . Cervical cancer Mother   . Diabetes Paternal Grandfather   . Prostate cancer Paternal Grandfather   . Heart disease Maternal Grandfather    Allergies  Allergen Reactions  . Hydrocodone Nausea And Vomiting    OBJECTIVE: Filed Vitals:   11/10/15 1105 11/10/15 1150 11/10/15 1314 11/10/15 1443  BP:   166/99 153/81  Pulse:   101 114  Temp: 99.8 F (37.7 C) 97.9 F (36.6 C) 98 F (36.7 C) 98.8 F (37.1 C)  TempSrc: Rectal Axillary Oral Oral  Resp:   24   Height:      Weight:      SpO2:   96%    Body mass index is 24.77 kg/(m^2).  Physical Exam  Constitutional: He is oriented to person, place, and time.  He looks much better today. He is smiling and relaxed.  Cardiovascular: Normal rate and regular rhythm.   No  murmur heard. Pulmonary/Chest: Effort normal and breath sounds normal.  Abdominal: Soft. There is tenderness.  Neurological: He is alert and oriented to person, place, and time.  Skin: No rash noted.  Psychiatric: Mood and affect normal.    Lab Results Lab Results  Component Value Date   WBC 16.0* 11/09/2015   HGB 13.8 11/09/2015   HCT 40.3 11/09/2015   MCV 92.4 11/09/2015   PLT 150 11/09/2015    Lab Results  Component Value Date   CREATININE 0.79 11/10/2015   BUN 6 11/10/2015   NA 135 11/10/2015   K 3.1* 11/10/2015   CL 104 11/10/2015   CO2 20* 11/10/2015    Lab Results  Component Value Date   ALT 21 11/07/2015   AST 28 11/07/2015   ALKPHOS 64 11/07/2015   BILITOT 0.8 11/07/2015     Microbiology: Recent Results (from the past 240 hour(s))  Urine culture     Status: Abnormal   Collection Time: 11/07/15  1:14 PM  Result Value Ref Range Status   Specimen Description URINE, RANDOM  Final   Special Requests NONE  Final   Culture >=100,000 COLONIES/mL STAPHYLOCOCCUS AUREUS (A)  Final   Report Status 11/09/2015 FINAL  Final   Organism ID, Bacteria STAPHYLOCOCCUS  AUREUS (A)  Final      Susceptibility   Staphylococcus aureus - MIC*    CIPROFLOXACIN <=0.5 SENSITIVE Sensitive     GENTAMICIN <=0.5 SENSITIVE Sensitive     NITROFURANTOIN <=16 SENSITIVE Sensitive     OXACILLIN <=0.25 SENSITIVE Sensitive     TETRACYCLINE >=16 RESISTANT Resistant     VANCOMYCIN <=0.5 SENSITIVE Sensitive     TRIMETH/SULFA <=10 SENSITIVE Sensitive     CLINDAMYCIN <=0.25 SENSITIVE Sensitive     RIFAMPIN <=0.5 SENSITIVE Sensitive     Inducible Clindamycin NEGATIVE Sensitive     * >=100,000 COLONIES/mL STAPHYLOCOCCUS AUREUS  Blood Culture (routine x 2)     Status: Abnormal   Collection Time: 11/07/15  5:15 PM  Result Value Ref Range Status   Specimen Description BLOOD RIGHT ANTECUBITAL  Final   Special Requests BOTTLES DRAWN AEROBIC AND ANAEROBIC 5CC  Final   Culture  Setup Time   Final     GRAM POSITIVE COCCI IN CLUSTERS IN BOTH AEROBIC AND ANAEROBIC BOTTLES CRITICAL RESULT CALLED TO, READ BACK BY AND VERIFIED WITH: J MARKEL 11/08/15 @ 0935 M VESTAL    Culture (A)  Final    STAPHYLOCOCCUS AUREUS SUSCEPTIBILITIES PERFORMED ON PREVIOUS CULTURE WITHIN THE LAST 5 DAYS.    Report Status 11/10/2015 FINAL  Final  Blood Culture ID Panel (Reflexed)     Status: Abnormal   Collection Time: 11/07/15  5:15 PM  Result Value Ref Range Status   Enterococcus species NOT DETECTED NOT DETECTED Final   Vancomycin resistance NOT DETECTED NOT DETECTED Final   Listeria monocytogenes NOT DETECTED NOT DETECTED Final   Staphylococcus species DETECTED (A) NOT DETECTED Final    Comment: CRITICAL RESULT CALLED TO, READ BACK BY AND VERIFIED WITH: J MARKEL 11/08/15 @ 0935 M VESTAL    Staphylococcus aureus DETECTED (A) NOT DETECTED Final    Comment: CRITICAL RESULT CALLED TO, READ BACK BY AND VERIFIED WITH: J MARKEL 11/08/15 @ 0935 M VESTAL    Methicillin resistance NOT DETECTED NOT DETECTED Final   Streptococcus species NOT DETECTED NOT DETECTED Final   Streptococcus agalactiae NOT DETECTED NOT DETECTED Final   Streptococcus pneumoniae NOT DETECTED NOT DETECTED Final   Streptococcus pyogenes NOT DETECTED NOT DETECTED Final   Acinetobacter baumannii NOT DETECTED NOT DETECTED Final   Enterobacteriaceae species NOT DETECTED NOT DETECTED Final   Enterobacter cloacae complex NOT DETECTED NOT DETECTED Final   Escherichia coli NOT DETECTED NOT DETECTED Final   Klebsiella oxytoca NOT DETECTED NOT DETECTED Final   Klebsiella pneumoniae NOT DETECTED NOT DETECTED Final   Proteus species NOT DETECTED NOT DETECTED Final   Serratia marcescens NOT DETECTED NOT DETECTED Final   Carbapenem resistance NOT DETECTED NOT DETECTED Final   Haemophilus influenzae NOT DETECTED NOT DETECTED Final   Neisseria meningitidis NOT DETECTED NOT DETECTED Final   Pseudomonas aeruginosa NOT DETECTED NOT DETECTED Final   Candida  albicans NOT DETECTED NOT DETECTED Final   Candida glabrata NOT DETECTED NOT DETECTED Final   Candida krusei NOT DETECTED NOT DETECTED Final   Candida parapsilosis NOT DETECTED NOT DETECTED Final   Candida tropicalis NOT DETECTED NOT DETECTED Final  Blood Culture (routine x 2)     Status: Abnormal   Collection Time: 11/07/15  5:24 PM  Result Value Ref Range Status   Specimen Description BLOOD RIGHT HAND  Final   Special Requests BOTTLES DRAWN AEROBIC AND ANAEROBIC 5CC  Final   Culture  Setup Time   Final    GRAM POSITIVE COCCI  IN CLUSTERS IN BOTH AEROBIC AND ANAEROBIC BOTTLES CRITICAL RESULT CALLED TO, READ BACK BY AND VERIFIED WITH: Cleotilde Neer 11/08/15 @ 0935 M VESTAL    Culture STAPHYLOCOCCUS AUREUS (A)  Final   Report Status 11/10/2015 FINAL  Final   Organism ID, Bacteria STAPHYLOCOCCUS AUREUS  Final      Susceptibility   Staphylococcus aureus - MIC*    CIPROFLOXACIN <=0.5 SENSITIVE Sensitive     ERYTHROMYCIN <=0.25 SENSITIVE Sensitive     GENTAMICIN <=0.5 SENSITIVE Sensitive     OXACILLIN <=0.25 SENSITIVE Sensitive     TETRACYCLINE >=16 RESISTANT Resistant     VANCOMYCIN <=0.5 SENSITIVE Sensitive     TRIMETH/SULFA <=10 SENSITIVE Sensitive     CLINDAMYCIN <=0.25 SENSITIVE Sensitive     RIFAMPIN <=0.5 SENSITIVE Sensitive     Inducible Clindamycin NEGATIVE Sensitive     * STAPHYLOCOCCUS AUREUS  MRSA PCR Screening     Status: None   Collection Time: 11/07/15 10:38 PM  Result Value Ref Range Status   MRSA by PCR NEGATIVE NEGATIVE Final    Comment:        The GeneXpert MRSA Assay (FDA approved for NASAL specimens only), is one component of a comprehensive MRSA colonization surveillance program. It is not intended to diagnose MRSA infection nor to guide or monitor treatment for MRSA infections.   Culture, blood (routine x 2)     Status: None (Preliminary result)   Collection Time: 11/09/15  7:05 AM  Result Value Ref Range Status   Specimen Description BLOOD LEFT ANTECUBITAL   Final   Special Requests IN PEDIATRIC BOTTLE  3CC  Final   Culture NO GROWTH 1 DAY  Final   Report Status PENDING  Incomplete  Culture, blood (routine x 2)     Status: None (Preliminary result)   Collection Time: 11/09/15  7:15 AM  Result Value Ref Range Status   Specimen Description BLOOD LEFT HAND  Final   Special Requests IN PEDIATRIC BOTTLE  3CC  Final   Culture NO GROWTH 1 DAY  Final   Report Status PENDING  Incomplete     ASSESSMENT: He is improving slowly on therapy for MSSA pyelonephritis and bacteremia. Repeat blood cultures are negative at 24 hours. He has no clinical or TTE evidence of endocarditis. If repeat blood cultures remain negative and he defervesced as soon I would be comfortable not proceeding with transesophageal echocardiogram. His bacteremia was caught so quickly I think the chance of endocarditis is quite low, especially since we have a known and treatable source of infection.  PLAN: 1. Continue cefazolin 2. Await results final blood cultures before PICC placement  Cliffton Asters, MD [facility] [phone] pager   [phone] cell 11/10/2015, 3:56 PM

## 2015-11-10 NOTE — Care Management Note (Signed)
Case Management Note  Patient Details  Name: James Marshall MRN: [id number] Date of Birth: 09/22/1980  Subjective/Objective:                 Patient admitted from home with sepsis, bacteremia MSSA, UTI pyelonephritis. If repeat bld cx remain neg, may get PICC- per MD note.   Action/Plan:  Anticipate HH IV and HH RN needs at DC.   Expected Discharge Date:                  Expected Discharge Plan:  Home w Home Health Services  In-House Referral:     Discharge planning Services  CM Consult  Post Acute Care Choice:    Choice offered to:     DME Arranged:    DME Agency:     HH Arranged:    HH Agency:     Status of Service:  In process, will continue to follow  If discussed at Long Length of Stay Meetings, dates discussed:    Additional Comments:  James SabalDebbie Kristapher [place], RN 11/10/2015, 4:31 PM

## 2015-11-10 NOTE — Progress Notes (Signed)
PROGRESS NOTE    James Marshall  ZOX:[id number] DOB: Nov 07, 1980 DOA: 11/07/2015 PCP: No primary care provider on file.    Brief Narrative: James Marshall is a 35 y.o. gentleman with a history of prior pyelonephritis who presents to the ED  RLQ abodminal pain with fever, nausea, and vomiting. Found to have sepsis from pyelonephritis from right renal stone. Continues to have fever over the last 2 days, ordered US renal to evaluate for renal abscess.  his echocardiogram is negative for vegetation.   Assessment & Plan:   Principal Problem:   Staphylococcus aureus bacteremia Active Problems:   Right ureteral stone   Sepsis (HCC)   Obstructive uropathy   Acute pyelonephritis   Normocytic anemia   sepsis from pyelonephritis from right ureteral stone: Admitted to step down for IV antibiotics , urology consulted and underwent cystoscopy with insertion of the right double j stent.  IV fluids, pain control .  Plan for ureteroscopy for stone removal in 7-10 days by urology.  Sepsis improving,but still running fevers and family is frustrated about the persistent fevers.Use cooling blanket as needed.  Lactic acid normalized.  Monitor wbc count.  Notified by lab that Blood cultures show MSSA, changed IV zosyn to IV ancef.  Repeat blood cultures ordered and have been negative so far  Echocardiogram ordered BY ID and is negative for vegetations.  If the repeat blood cultures are negative, then plan for PICC line in the next 24 hours .  Ordered renal US to evaluate for abscess.     Normocytic anemia: Monitor.    Hypokalemia: replete and repeat level in am.get mag level.   Headache : probably from the fevers . Improved.   Hypertension: Pt was told he had hypertension prior to admit on 2 different occasions.  Added prn hydralazine .  Might need to put him on meds on discharge.        DVT prophylaxis: (Lovenox/ Code Status: (Full code Family Communication: multiple family  members at bedside.  Disposition Plan: pending further management.    Consultants:   Urology.    Procedures:  Cystoscopy with insertion of right double-J stent placement on 7/8  Antimicrobials: IV ZOSYN FROM 7/8 to 7/9 IV ancef FROM 7/9/   Subjective: No new complaints. Reports abdominal pain and flank pain is almost gone, no nausea, or vomiting.  No sob or chest pain. Headache improved.   Objective: Filed Vitals:   11/10/15 1105 11/10/15 1150 11/10/15 1314 11/10/15 1443  BP:   166/99 153/81  Pulse:   101 114  Temp: 99.8 F (37.7 C) 97.9 F (36.6 C) 98 F (36.7 C) 98.8 F (37.1 C)  TempSrc: Rectal Axillary Oral Oral  Resp:   24   Height:      Weight:      SpO2:   96%     Intake/Output Summary (Last 24 hours) at 11/10/15 1615 Last data filed at 11/10/15 0644  Gross per 24 hour  Intake   1700 ml  Output    325 ml  Net   1375 ml   Filed Weights   11/07/15 1308 11/09/15 1733  Weight: 79.379 kg (175 lb) 78.3 kg (172 lb 9.9 oz)    Examination:  General exam: Appears anxious but not in any disress.  Respiratory system: Clear to auscultation. Respiratory effort normal. Cardiovascular system: S1 & S2 heard, RRR. No JVD, murmurs, rubs, gallops or clicks. No pedal edema. Gastrointestinal system: right upper quadrnat and flank pain on the right  present, with some guarding, no signs of peritonitis. Non distended abdomen and normal bowel sounds.  Central nervous system: Alert and oriented. No focal neurological deficits. Extremities: Symmetric 5 x 5 power. Skin: No rashes, lesions or ulcers Psychiatry: Judgement and insight appear normal. Mood & affect appropriate.     Data Reviewed: I have personally reviewed following labs and imaging studies  CBC:  Recent Labs Lab 11/07/15 1315 11/08/15 0336 11/08/15 2243 11/09/15 1105  WBC 11.5* 21.3* 18.5* 16.0*  HGB 14.8 11.6* 14.7 13.8  HCT 41.8 34.4* 42.8 40.3  MCV 91.3 94.0 93.4 92.4  PLT 307 193 174 150   Basic  Metabolic Panel:  Recent Labs Lab 11/07/15 1315 11/08/15 0336 11/08/15 2243 11/09/15 1105 11/10/15 0753  NA 139 138 133* 135 135  K 3.5 3.5 3.4* 3.6 3.1*  CL 106 107 101 104 104  CO2 23 27 21* 24 20*  GLUCOSE 141* 111* 112* 89 83  BUN CREATININE 0.79 0.89 0.87 0.76 0.79  CALCIUM 9.5 8.4* 8.4* 8.4* 8.2*   GFR: Estimated Creatinine Clearance: 131.8 mL/min (by C-G formula based on Cr of 0.79). Liver Function Tests:  Recent Labs Lab 11/07/15 1315  AST 28  ALT 21  ALKPHOS 64  BILITOT 0.8  PROT 7.3  ALBUMIN 4.3    Recent Labs Lab 11/07/15 1315  LIPASE 25   No results for input(s): AMMONIA in the last 168 hours. Coagulation Profile:  Recent Labs Lab 11/07/15 2150  INR 1.18   Cardiac Enzymes: No results for input(s): CKTOTAL, CKMB, CKMBINDEX, TROPONINI in the last 168 hours. BNP (last 3 results) No results for input(s): PROBNP in the last 8760 hours. HbA1C: No results for input(s): HGBA1C in the last 72 hours. CBG: No results for input(s): GLUCAP in the last 168 hours. Lipid Profile: No results for input(s): CHOL, HDL, LDLCALC, TRIG, CHOLHDL, LDLDIRECT in the last 72 hours. Thyroid Function Tests: No results for input(s): TSH, T4TOTAL, FREET4, T3FREE, THYROIDAB in the last 72 hours. Anemia Panel: No results for input(s): VITAMINB12, FOLATE, FERRITIN, TIBC, IRON, RETICCTPCT in the last 72 hours. Sepsis Labs:  Recent Labs Lab 11/07/15 2150 11/07/15 2158 11/08/15 0017 11/08/15 2243 11/09/15 0211  PROCALCITON 1.17  --   --   --   --   LATICACIDVEN  --  1.2 0.9 2.0* 1.5    Recent Results (from the past 240 hour(s))  Urine culture     Status: Abnormal   Collection Time: 11/07/15  1:14 PM  Result Value Ref Range Status   Specimen Description URINE, RANDOM  Final   Special Requests NONE  Final   Culture >=100,000 COLONIES/mL STAPHYLOCOCCUS AUREUS (A)  Final   Report Status 11/09/2015 FINAL  Final   Organism ID, Bacteria STAPHYLOCOCCUS  AUREUS (A)  Final      Susceptibility   Staphylococcus aureus - MIC*    CIPROFLOXACIN <=0.5 SENSITIVE Sensitive     GENTAMICIN <=0.5 SENSITIVE Sensitive     NITROFURANTOIN <=16 SENSITIVE Sensitive     OXACILLIN <=0.25 SENSITIVE Sensitive     TETRACYCLINE >=16 RESISTANT Resistant     VANCOMYCIN <=0.5 SENSITIVE Sensitive     TRIMETH/SULFA <=10 SENSITIVE Sensitive     CLINDAMYCIN <=0.25 SENSITIVE Sensitive     RIFAMPIN <=0.5 SENSITIVE Sensitive     Inducible Clindamycin NEGATIVE Sensitive     * >=100,000 COLONIES/mL STAPHYLOCOCCUS AUREUS  Blood Culture (routine x 2)     Status: Abnormal   Collection Time: 11/07/15  5:15  PM  Result Value Ref Range Status   Specimen Description BLOOD RIGHT ANTECUBITAL  Final   Special Requests BOTTLES DRAWN AEROBIC AND ANAEROBIC 5CC  Final   Culture  Setup Time   Final    GRAM POSITIVE COCCI IN CLUSTERS IN BOTH AEROBIC AND ANAEROBIC BOTTLES CRITICAL RESULT CALLED TO, READ BACK BY AND VERIFIED WITH: J MARKEL 11/08/15 @ 0935 M VESTAL    Culture (A)  Final    STAPHYLOCOCCUS AUREUS SUSCEPTIBILITIES PERFORMED ON PREVIOUS CULTURE WITHIN THE LAST 5 DAYS.    Report Status 11/10/2015 FINAL  Final  Blood Culture ID Panel (Reflexed)     Status: Abnormal   Collection Time: 11/07/15  5:15 PM  Result Value Ref Range Status   Enterococcus species NOT DETECTED NOT DETECTED Final   Vancomycin resistance NOT DETECTED NOT DETECTED Final   Listeria monocytogenes NOT DETECTED NOT DETECTED Final   Staphylococcus species DETECTED (A) NOT DETECTED Final    Comment: CRITICAL RESULT CALLED TO, READ BACK BY AND VERIFIED WITH: J MARKEL 11/08/15 @ 0935 M VESTAL    Staphylococcus aureus DETECTED (A) NOT DETECTED Final    Comment: CRITICAL RESULT CALLED TO, READ BACK BY AND VERIFIED WITH: J MARKEL 11/08/15 @ 0935 M VESTAL    Methicillin resistance NOT DETECTED NOT DETECTED Final   Streptococcus species NOT DETECTED NOT DETECTED Final   Streptococcus agalactiae NOT DETECTED NOT  DETECTED Final   Streptococcus pneumoniae NOT DETECTED NOT DETECTED Final   Streptococcus pyogenes NOT DETECTED NOT DETECTED Final   Acinetobacter baumannii NOT DETECTED NOT DETECTED Final   Enterobacteriaceae species NOT DETECTED NOT DETECTED Final   Enterobacter cloacae complex NOT DETECTED NOT DETECTED Final   Escherichia coli NOT DETECTED NOT DETECTED Final   Klebsiella oxytoca NOT DETECTED NOT DETECTED Final   Klebsiella pneumoniae NOT DETECTED NOT DETECTED Final   Proteus species NOT DETECTED NOT DETECTED Final   Serratia marcescens NOT DETECTED NOT DETECTED Final   Carbapenem resistance NOT DETECTED NOT DETECTED Final   Haemophilus influenzae NOT DETECTED NOT DETECTED Final   Neisseria meningitidis NOT DETECTED NOT DETECTED Final   Pseudomonas aeruginosa NOT DETECTED NOT DETECTED Final   Candida albicans NOT DETECTED NOT DETECTED Final   Candida glabrata NOT DETECTED NOT DETECTED Final   Candida krusei NOT DETECTED NOT DETECTED Final   Candida parapsilosis NOT DETECTED NOT DETECTED Final   Candida tropicalis NOT DETECTED NOT DETECTED Final  Blood Culture (routine x 2)     Status: Abnormal   Collection Time: 11/07/15  5:24 PM  Result Value Ref Range Status   Specimen Description BLOOD RIGHT HAND  Final   Special Requests BOTTLES DRAWN AEROBIC AND ANAEROBIC 5CC  Final   Culture  Setup Time   Final    GRAM POSITIVE COCCI IN CLUSTERS IN BOTH AEROBIC AND ANAEROBIC BOTTLES CRITICAL RESULT CALLED TO, READ BACK BY AND VERIFIED WITH: J MARKEL 11/08/15 @ 0935 M VESTAL    Culture STAPHYLOCOCCUS AUREUS (A)  Final   Report Status 11/10/2015 FINAL  Final   Organism ID, Bacteria STAPHYLOCOCCUS AUREUS  Final      Susceptibility   Staphylococcus aureus - MIC*    CIPROFLOXACIN <=0.5 SENSITIVE Sensitive     ERYTHROMYCIN <=0.25 SENSITIVE Sensitive     GENTAMICIN <=0.5 SENSITIVE Sensitive     OXACILLIN <=0.25 SENSITIVE Sensitive     TETRACYCLINE >=16 RESISTANT Resistant     VANCOMYCIN <=0.5  SENSITIVE Sensitive     TRIMETH/SULFA <=10 SENSITIVE Sensitive     CLINDAMYCIN <=  0.25 SENSITIVE Sensitive     RIFAMPIN <=0.5 SENSITIVE Sensitive     Inducible Clindamycin NEGATIVE Sensitive     * STAPHYLOCOCCUS AUREUS  MRSA PCR Screening     Status: None   Collection Time: 11/07/15 10:38 PM  Result Value Ref Range Status   MRSA by PCR NEGATIVE NEGATIVE Final    Comment:        The GeneXpert MRSA Assay (FDA approved for NASAL specimens only), is one component of a comprehensive MRSA colonization surveillance program. It is not intended to diagnose MRSA infection nor to guide or monitor treatment for MRSA infections.   Culture, blood (routine x 2)     Status: None (Preliminary result)   Collection Time: 11/09/15  7:05 AM  Result Value Ref Range Status   Specimen Description BLOOD LEFT ANTECUBITAL  Final   Special Requests IN PEDIATRIC BOTTLE  3CC  Final   Culture NO GROWTH 1 DAY  Final   Report Status PENDING  Incomplete  Culture, blood (routine x 2)     Status: None (Preliminary result)   Collection Time: 11/09/15  7:15 AM  Result Value Ref Range Status   Specimen Description BLOOD LEFT HAND  Final   Special Requests IN PEDIATRIC BOTTLE  3CC  Final   Culture NO GROWTH 1 DAY  Final   Report Status PENDING  Incomplete         Radiology Studies: No results found.      Scheduled Meds: .  ceFAZolin (ANCEF) IV  2 g Intravenous Q8H  . potassium chloride  40 mEq Oral BID  . sodium chloride flush  3 mL Intravenous Q12H   Continuous Infusions: . sodium chloride 125 mL/hr at 11/10/15 0556     LOS: 3 days    Time spent: 30 minutes.     Kathlen ModyAKULA,Raeya Merritts, MD [facility] Pager [phone]  If 7PM-7AM, please contact night-coverage [email or web address] Password [email or web address] 11/10/2015, 4:15 PM

## 2015-11-10 NOTE — Progress Notes (Signed)
Rectal probe removed for patient to have BM, pt requesting to leave out at this time. Informed Dr. Blake DivineAkula.

## 2015-11-11 DIAGNOSIS — I10 Essential (primary) hypertension: Secondary | ICD-10-CM | POA: Insufficient documentation

## 2015-11-11 LAB — CBC
HEMATOCRIT: 33.7 % — AB (ref 39.0–52.0)
Hemoglobin: 11.9 g/dL — ABNORMAL LOW (ref 13.0–17.0)
MCH: 31.4 pg (ref 26.0–34.0)
MCHC: 35.3 g/dL (ref 30.0–36.0)
MCV: 88.9 fL (ref 78.0–100.0)
PLATELETS: 200 10*3/uL (ref 150–400)
RBC: 3.79 MIL/uL — AB (ref 4.22–5.81)
RDW: 12.1 % (ref 11.5–15.5)
WBC: 9.8 10*3/uL (ref 4.0–10.5)

## 2015-11-11 LAB — BASIC METABOLIC PANEL
Anion gap: 10 (ref 5–15)
CHLORIDE: 106 mmol/L (ref 101–111)
CO2: 19 mmol/L — AB (ref 22–32)
CREATININE: 0.79 mg/dL (ref 0.61–1.24)
Calcium: 7.9 mg/dL — ABNORMAL LOW (ref 8.9–10.3)
GFR calc Af Amer: >60 mL/min (ref >60)
GFR calc non Af Amer: >60 mL/min (ref >60)
Glucose, Bld: 95 mg/dL (ref 65–99)
POTASSIUM: 3.2 mmol/L — AB (ref 3.5–5.1)
Sodium: 135 mmol/L (ref 135–145)

## 2015-11-11 MED ORDER — AMLODIPINE BESYLATE 5 MG PO TABS
5.0000 mg | ORAL_TABLET | Freq: Every day | ORAL | Status: DC
  Administered 2015-11-12: 5 mg via ORAL
  Filled 2015-11-11: qty 1

## 2015-11-11 MED ORDER — POTASSIUM CHLORIDE CRYS ER 20 MEQ PO TBCR
40.0000 meq | EXTENDED_RELEASE_TABLET | ORAL | Status: AC
Start: 2015-11-11 — End: 2015-11-11
  Administered 2015-11-11 (×2): 40 meq via ORAL
  Filled 2015-11-11 (×2): qty 2

## 2015-11-11 MED ORDER — MAGNESIUM OXIDE 400 (241.3 MG) MG PO TABS
400.0000 mg | ORAL_TABLET | Freq: Two times a day (BID) | ORAL | Status: DC
  Administered 2015-11-11 – 2015-11-12 (×3): 400 mg via ORAL
  Filled 2015-11-11 (×3): qty 1

## 2015-11-11 NOTE — Care Management Note (Signed)
Case Management Note  Patient Details  Name: Otila KluverDonnald B XXXBlack MRN: [id number] Date of Birth: 06/29/1980  Subjective/Objective:                 Pt to have PICC placed tomorrow, likely DC tomorrow afternoon. Dr Orvan Falconerampbell willing [facility]. Patient attempting to find PCP via [facility] but everyone booked out or unable to take new patients. [facility].    Action/Plan:  HH w/ IV Abx. And RN.  No further CM needs.  Expected Discharge Date:                  Expected Discharge Plan:  Home w Home Health Services  In-House Referral:     Discharge planning Services  CM Consult  Post Acute Care Choice:  Durable Medical Equipment, Home Health Choice offered to:  Patient  DME Arranged:  IV pump/equipment DME Agency:  [facility]  HH Arranged:  RN, IV Antibiotics HH Agency:  [facility]  Status of Service:  Completed, signed off  If discussed at Long Length of Stay Meetings, dates discussed:    Additional Comments:  Lawerance SabalDebbie Blakely [place], RN 11/11/2015, 3:55 PM

## 2015-11-11 NOTE — Progress Notes (Signed)
Patient ID: James Marshall, male   DOB: 1981-02-06, 35 y.o.   MRN: [id number]         [facility]    Date of Admission:  11/07/2015           Day 4 antibiotics  Principal Problem:   Staphylococcus aureus bacteremia Active Problems:   Sepsis (HCC)   Acute pyelonephritis   Right ureteral stone   Obstructive uropathy   Normocytic anemia   .  ceFAZolin (ANCEF) IV  2 g Intravenous Q8H  . sodium chloride flush  3 mL Intravenous Q12H    SUBJECTIVE: He Had fever again last night but is feeling better today. He is requiring less pain medication. His appetite has improved.  Review of Systems: Review of Systems  Constitutional: Positive for fever, chills and malaise/fatigue. Negative for diaphoresis.  Gastrointestinal: Positive for abdominal pain. Negative for nausea, vomiting and diarrhea.  Genitourinary: Positive for flank pain.  Neurological: Negative for headaches.    Past Medical History  Diagnosis Date  . Acute pyelonephritis     Social History  Substance Use Topics  . Smoking status: Former Games developer  . Smokeless tobacco: None  . Alcohol Use: Yes     Comment: 2-3 times per week, no history of EtOH withdrawal    Family History  Problem Relation Age of Onset  . Cervical cancer Mother   . Diabetes Paternal Grandfather   . Prostate cancer Paternal Grandfather   . Heart disease Maternal Grandfather    Allergies  Allergen Reactions  . Hydrocodone Nausea And Vomiting    OBJECTIVE: Filed Vitals:   11/10/15 2150 11/11/15 0347 11/11/15 0517 11/11/15 0800  BP: 156/92   163/95  Pulse: 102   108  Temp:  100.7 F (38.2 C) 101 F (38.3 C) 99 F (37.2 C)  TempSrc:  Oral Oral Oral  Resp: 16   16  Height:      Weight:      SpO2: 98%   98%   Body mass index is 24.77 kg/(m^2).  Physical Exam  Constitutional: He is oriented to person, place, and time.  He looks much better today. He is smiling and relaxed visiting with family.    Cardiovascular: Normal rate and regular rhythm.   No murmur heard. Pulmonary/Chest: Effort normal and breath sounds normal.  Abdominal: Soft. There is tenderness.  Neurological: He is alert and oriented to person, place, and time.  Skin: No rash noted.  Psychiatric: Mood and affect normal.    Lab Results Lab Results  Component Value Date   WBC 9.8 11/11/2015   HGB 11.9* 11/11/2015   HCT 33.7* 11/11/2015   MCV 88.9 11/11/2015   PLT 200 11/11/2015    Lab Results  Component Value Date   CREATININE 0.79 11/11/2015   BUN <5* 11/11/2015   NA 135 11/11/2015   K 3.2* 11/11/2015   CL 106 11/11/2015   CO2 19* 11/11/2015    Lab Results  Component Value Date   ALT 21 11/07/2015   AST 28 11/07/2015   ALKPHOS 64 11/07/2015   BILITOT 0.8 11/07/2015     Microbiology: Recent Results (from the past 240 hour(s))  Urine culture     Status: Abnormal   Collection Time: 11/07/15  1:14 PM  Result Value Ref Range Status   Specimen Description URINE, RANDOM  Final   Special Requests NONE  Final   Culture >=100,000 COLONIES/mL STAPHYLOCOCCUS AUREUS (A)  Final   Report Status 11/09/2015 FINAL  Final   Organism ID, Bacteria STAPHYLOCOCCUS AUREUS (A)  Final      Susceptibility   Staphylococcus aureus - MIC*    CIPROFLOXACIN <=0.5 SENSITIVE Sensitive     GENTAMICIN <=0.5 SENSITIVE Sensitive     NITROFURANTOIN <=16 SENSITIVE Sensitive     OXACILLIN <=0.25 SENSITIVE Sensitive     TETRACYCLINE >=16 RESISTANT Resistant     VANCOMYCIN <=0.5 SENSITIVE Sensitive     TRIMETH/SULFA <=10 SENSITIVE Sensitive     CLINDAMYCIN <=0.25 SENSITIVE Sensitive     RIFAMPIN <=0.5 SENSITIVE Sensitive     Inducible Clindamycin NEGATIVE Sensitive     * >=100,000 COLONIES/mL STAPHYLOCOCCUS AUREUS  Blood Culture (routine x 2)     Status: Abnormal   Collection Time: 11/07/15  5:15 PM  Result Value Ref Range Status   Specimen Description BLOOD RIGHT ANTECUBITAL  Final   Special Requests BOTTLES DRAWN AEROBIC AND  ANAEROBIC 5CC  Final   Culture  Setup Time   Final    GRAM POSITIVE COCCI IN CLUSTERS IN BOTH AEROBIC AND ANAEROBIC BOTTLES CRITICAL RESULT CALLED TO, READ BACK BY AND VERIFIED WITH: J MARKEL 11/08/15 @ 0935 M VESTAL    Culture (A)  Final    STAPHYLOCOCCUS AUREUS SUSCEPTIBILITIES PERFORMED ON PREVIOUS CULTURE WITHIN THE LAST 5 DAYS.    Report Status 11/10/2015 FINAL  Final  Blood Culture ID Panel (Reflexed)     Status: Abnormal   Collection Time: 11/07/15  5:15 PM  Result Value Ref Range Status   Enterococcus species NOT DETECTED NOT DETECTED Final   Vancomycin resistance NOT DETECTED NOT DETECTED Final   Listeria monocytogenes NOT DETECTED NOT DETECTED Final   Staphylococcus species DETECTED (A) NOT DETECTED Final    Comment: CRITICAL RESULT CALLED TO, READ BACK BY AND VERIFIED WITH: J MARKEL 11/08/15 @ 0935 M VESTAL    Staphylococcus aureus DETECTED (A) NOT DETECTED Final    Comment: CRITICAL RESULT CALLED TO, READ BACK BY AND VERIFIED WITH: J MARKEL 11/08/15 @ 0935 M VESTAL    Methicillin resistance NOT DETECTED NOT DETECTED Final   Streptococcus species NOT DETECTED NOT DETECTED Final   Streptococcus agalactiae NOT DETECTED NOT DETECTED Final   Streptococcus pneumoniae NOT DETECTED NOT DETECTED Final   Streptococcus pyogenes NOT DETECTED NOT DETECTED Final   Acinetobacter baumannii NOT DETECTED NOT DETECTED Final   Enterobacteriaceae species NOT DETECTED NOT DETECTED Final   Enterobacter cloacae complex NOT DETECTED NOT DETECTED Final   Escherichia coli NOT DETECTED NOT DETECTED Final   Klebsiella oxytoca NOT DETECTED NOT DETECTED Final   Klebsiella pneumoniae NOT DETECTED NOT DETECTED Final   Proteus species NOT DETECTED NOT DETECTED Final   Serratia marcescens NOT DETECTED NOT DETECTED Final   Carbapenem resistance NOT DETECTED NOT DETECTED Final   Haemophilus influenzae NOT DETECTED NOT DETECTED Final   Neisseria meningitidis NOT DETECTED NOT DETECTED Final   Pseudomonas  aeruginosa NOT DETECTED NOT DETECTED Final   Candida albicans NOT DETECTED NOT DETECTED Final   Candida glabrata NOT DETECTED NOT DETECTED Final   Candida krusei NOT DETECTED NOT DETECTED Final   Candida parapsilosis NOT DETECTED NOT DETECTED Final   Candida tropicalis NOT DETECTED NOT DETECTED Final  Blood Culture (routine x 2)     Status: Abnormal   Collection Time: 11/07/15  5:24 PM  Result Value Ref Range Status   Specimen Description BLOOD RIGHT HAND  Final   Special Requests BOTTLES DRAWN AEROBIC AND ANAEROBIC 5CC  Final   Culture  Setup Time  Final    GRAM POSITIVE COCCI IN CLUSTERS IN BOTH AEROBIC AND ANAEROBIC BOTTLES CRITICAL RESULT CALLED TO, READ BACK BY AND VERIFIED WITH: Cleotilde Neer 11/08/15 @ 0935 M VESTAL    Culture STAPHYLOCOCCUS AUREUS (A)  Final   Report Status 11/10/2015 FINAL  Final   Organism ID, Bacteria STAPHYLOCOCCUS AUREUS  Final      Susceptibility   Staphylococcus aureus - MIC*    CIPROFLOXACIN <=0.5 SENSITIVE Sensitive     ERYTHROMYCIN <=0.25 SENSITIVE Sensitive     GENTAMICIN <=0.5 SENSITIVE Sensitive     OXACILLIN <=0.25 SENSITIVE Sensitive     TETRACYCLINE >=16 RESISTANT Resistant     VANCOMYCIN <=0.5 SENSITIVE Sensitive     TRIMETH/SULFA <=10 SENSITIVE Sensitive     CLINDAMYCIN <=0.25 SENSITIVE Sensitive     RIFAMPIN <=0.5 SENSITIVE Sensitive     Inducible Clindamycin NEGATIVE Sensitive     * STAPHYLOCOCCUS AUREUS  MRSA PCR Screening     Status: None   Collection Time: 11/07/15 10:38 PM  Result Value Ref Range Status   MRSA by PCR NEGATIVE NEGATIVE Final    Comment:        The GeneXpert MRSA Assay (FDA approved for NASAL specimens only), is one component of a comprehensive MRSA colonization surveillance program. It is not intended to diagnose MRSA infection nor to guide or monitor treatment for MRSA infections.   Culture, blood (routine x 2)     Status: None (Preliminary result)   Collection Time: 11/09/15  7:05 AM  Result Value Ref Range  Status   Specimen Description BLOOD LEFT ANTECUBITAL  Final   Special Requests IN PEDIATRIC BOTTLE  3CC  Final   Culture NO GROWTH 1 DAY  Final   Report Status PENDING  Incomplete  Culture, blood (routine x 2)     Status: None (Preliminary result)   Collection Time: 11/09/15  7:15 AM  Result Value Ref Range Status   Specimen Description BLOOD LEFT HAND  Final   Special Requests IN PEDIATRIC BOTTLE  3CC  Final   Culture NO GROWTH 1 DAY  Final   Report Status PENDING  Incomplete     ASSESSMENT: He is improving slowly on therapy for MSSA pyelonephritis and bacteremia. His repeat blood cultures are negative at 48 hours, his temperature curve is trending down and his leukocytosis has resolved. Ultrasound does not reveal any evidence of intrarenal or perinephric abscess. There is no evidence of endocarditis on exam or transthoracic echocardiogram. Given that we have a known, treatable source for his bacteremia, he has no pre-existing valvular heart disease and blood cultures are negative at 36 hours I do not see a need to proceed with transesophageal echocardiogram. He will need at least 2 weeks of IV cefazolin. Optimal duration will also depend on whether he needs any further urologic procedures in the near future.  PLAN: 1. Continue cefazolin 2. PICC placement in a.m. if blood cultures remain negative  Cliffton Asters, MD [facility] [phone] pager   [phone] cell 11/11/2015, 12:51 PM

## 2015-11-11 NOTE — Progress Notes (Signed)
PROGRESS NOTE    AJAI HARVILLE  ZOX:[id number] DOB: May 19, 1980 DOA: 11/07/2015 PCP: No primary care provider on file.    Brief Narrative: James Marshall is a 35 y.o. gentleman with a history of prior pyelonephritis who presents to the ED  RLQ abodminal pain with fever, nausea, and vomiting. Found to have sepsis from pyelonephritis from right renal stone and MSSA bacteremia.   PICC line planned for 7/13 and if stable, discharge home on IV antibiotics for 14 days.   Assessment & Plan: Principal Problem:   Staphylococcus aureus bacteremia Active Problems:   Right ureteral stone   Sepsis (HCC)   Obstructive uropathy   Acute pyelonephritis   Normocytic anemia HTN  Sepsis from pyelonephritis from right ureteral stone and bacteremia: -Admitted to step down for IV antibiotics , urology consulted and underwent cystoscopy with -insertion of the right double j stent.  -Will continue IV fluids, pain control .  -Plan for ureteroscopy for stone removal in 7-10 days by urology.   -Lactic acid normalized.  -WBC's WNL now -Fever subsiding/resolving  MSSA bacteremia -on IV ancef as per ID rec's -plan is to treat for 14 days -2D echo w/o vegetations -given improvement in his symptoms; no TEE will be pursuit -continue PRN antipyretics -PICC line ordered for 7/13 -repeat blood cx's remains neg  Essential HTN -will treat with amlodipine -heart healthy diet recommended  Normocytic anemia: -Monitor.  -no signs of overt bleeding appreciated -Hgb stable  Hypokalemia:  -replete and follow trend -will check Mg level   DVT prophylaxis: (Lovenox) Code Status: (Full code) Family Communication: multiple family members at bedside.  Disposition Plan: PICC to be place on 7/13 AM; plan is for home discharge with IV antibiotics ( 14 days) and follow up with ID and urology as an outpatient.   Consultants:   Urology.   ID   Procedures:   Cystoscopy with insertion of right double-J  stent  2-D echo: - Left ventricle: The cavity size was normal. Wall thickness was  normal. Systolic function was normal. The estimated ejection  fraction was in the range of 60% to 65%. Wall motion was normal;  there were no regional wall motion abnormalities. Left  ventricular diastolic function parameters were normal. - Mitral valve: There was mild regurgitation directed posteriorly. - Impressions: There was no evidence of a vegetation.  Impressions: - There was no evidence of a vegetation.  Antimicrobials:  IV ZOSYN FROM 7/8 to 7/9 IV ancef FROM 11/08/15>>> anticipated 14 days of treatment.  Subjective: Fever curve improving and WBC's now WNL. Patient also with improvement on his abd pain. Denies nausea/vomiting.  Objective: Filed Vitals:   11/11/15 0517 11/11/15 0800 11/11/15 1442 11/11/15 2127  BP:  163/95 162/102 151/92  Pulse:  108 93 81  Temp: 101 F (38.3 C) 99 F (37.2 C) 99.1 F (37.3 C) 98.5 F (36.9 C)  TempSrc: Oral Oral Oral   Resp:  16 20 18   Height:      Weight:      SpO2:  98% 95% 96%    Intake/Output Summary (Last 24 hours) at 11/11/15 2244 Last data filed at 11/11/15 0800  Gross per 24 hour  Intake   1700 ml  Output   1750 ml  Net    -50 ml   Filed Weights   11/07/15 1308 11/09/15 1733  Weight: 79.379 kg (175 lb) 78.3 kg (172 lb 9.9 oz)    Examination:  General exam: currently afebrile, in no acute distress and  denies CP, SOB. Respiratory system: Clear to auscultation. Respiratory effort normal. Cardiovascular system: S1 & S2 heard, RRR. No JVD, murmurs, rubs, gallops or clicks. No pedal edema. Gastrointestinal system: mild right upper quadrant  and flank pain on deep palpation, positive BS, no distension and no guarding. Central nervous system: Alert and oriented. No focal neurological deficits. Extremities: Symmetric 5 x 5 power. Skin: No rashes, lesions or ulcers Psychiatry: Judgement and insight appear normal. Mood & affect  appropriate.     Data Reviewed: I have personally reviewed following labs and imaging studies  CBC:  Recent Labs Lab 11/07/15 1315 11/08/15 0336 11/08/15 2243 11/09/15 1105 11/11/15 0629  WBC 11.5* 21.3* 18.5* 16.0* 9.8  HGB 14.8 11.6* 14.7 13.8 11.9*  HCT 41.8 34.4* 42.8 40.3 33.7*  MCV 91.3 94.0 93.4 92.4 88.9  PLT 307 193 174 150 200   Basic Metabolic Panel:  Recent Labs Lab 11/08/15 0336 11/08/15 2243 11/09/15 1105 11/10/15 0753 11/10/15 1715 11/11/15 0629  NA 138 133* 135 135  --  135  K 3.5 3.4* 3.6 3.1*  --  3.2*  CL 107 101 104 104  --  106  CO2 27 21* 24 20*  --  19*  GLUCOSE 111* 112* 89 83  --  95  BUN 12 9 6 6   --  <5*  CREATININE 0.89 0.87 0.76 0.79  --  0.79  CALCIUM 8.4* 8.4* 8.4* 8.2*  --  7.9*  MG  --   --   --   --  1.5*  --    GFR: Estimated Creatinine Clearance: 131.8 mL/min (by C-G formula based on Cr of 0.79).   Liver Function Tests:  Recent Labs Lab 11/07/15 1315  AST 28  ALT 21  ALKPHOS 64  BILITOT 0.8  PROT 7.3  ALBUMIN 4.3    Recent Labs Lab 11/07/15 1315  LIPASE 25   Coagulation Profile:  Recent Labs Lab 11/07/15 2150  INR 1.18   Sepsis Labs:  Recent Labs Lab 11/07/15 2150 11/07/15 2158 11/08/15 0017 11/08/15 2243 11/09/15 0211  PROCALCITON 1.17  --   --   --   --   LATICACIDVEN  --  1.2 0.9 2.0* 1.5    Recent Results (from the past 240 hour(s))  Urine culture     Status: Abnormal   Collection Time: 11/07/15  1:14 PM  Result Value Ref Range Status   Specimen Description URINE, RANDOM  Final   Special Requests NONE  Final   Culture >=100,000 COLONIES/mL STAPHYLOCOCCUS AUREUS (A)  Final   Report Status 11/09/2015 FINAL  Final   Organism ID, Bacteria STAPHYLOCOCCUS AUREUS (A)  Final      Susceptibility   Staphylococcus aureus - MIC*    CIPROFLOXACIN <=0.5 SENSITIVE Sensitive     GENTAMICIN <=0.5 SENSITIVE Sensitive     NITROFURANTOIN <=16 SENSITIVE Sensitive     OXACILLIN <=0.25 SENSITIVE Sensitive      TETRACYCLINE >=16 RESISTANT Resistant     VANCOMYCIN <=0.5 SENSITIVE Sensitive     TRIMETH/SULFA <=10 SENSITIVE Sensitive     CLINDAMYCIN <=0.25 SENSITIVE Sensitive     RIFAMPIN <=0.5 SENSITIVE Sensitive     Inducible Clindamycin NEGATIVE Sensitive     * >=100,000 COLONIES/mL STAPHYLOCOCCUS AUREUS  Blood Culture (routine x 2)     Status: Abnormal   Collection Time: 11/07/15  5:15 PM  Result Value Ref Range Status   Specimen Description BLOOD RIGHT ANTECUBITAL  Final   Special Requests BOTTLES DRAWN AEROBIC AND ANAEROBIC 5CC  Final   Culture  Setup Time   Final    GRAM POSITIVE COCCI IN CLUSTERS IN BOTH AEROBIC AND ANAEROBIC BOTTLES CRITICAL RESULT CALLED TO, READ BACK BY AND VERIFIED WITH: J MARKEL 11/08/15 @ 0935 M VESTAL    Culture (A)  Final    STAPHYLOCOCCUS AUREUS SUSCEPTIBILITIES PERFORMED ON PREVIOUS CULTURE WITHIN THE LAST 5 DAYS.    Report Status 11/10/2015 FINAL  Final  Blood Culture ID Panel (Reflexed)     Status: Abnormal   Collection Time: 11/07/15  5:15 PM  Result Value Ref Range Status   Enterococcus species NOT DETECTED NOT DETECTED Final   Vancomycin resistance NOT DETECTED NOT DETECTED Final   Listeria monocytogenes NOT DETECTED NOT DETECTED Final   Staphylococcus species DETECTED (A) NOT DETECTED Final    Comment: CRITICAL RESULT CALLED TO, READ BACK BY AND VERIFIED WITH: J MARKEL 11/08/15 @ 0935 M VESTAL    Staphylococcus aureus DETECTED (A) NOT DETECTED Final    Comment: CRITICAL RESULT CALLED TO, READ BACK BY AND VERIFIED WITH: J MARKEL 11/08/15 @ 0935 M VESTAL    Methicillin resistance NOT DETECTED NOT DETECTED Final   Streptococcus species NOT DETECTED NOT DETECTED Final   Streptococcus agalactiae NOT DETECTED NOT DETECTED Final   Streptococcus pneumoniae NOT DETECTED NOT DETECTED Final   Streptococcus pyogenes NOT DETECTED NOT DETECTED Final   Acinetobacter baumannii NOT DETECTED NOT DETECTED Final   Enterobacteriaceae species NOT DETECTED NOT  DETECTED Final   Enterobacter cloacae complex NOT DETECTED NOT DETECTED Final   Escherichia coli NOT DETECTED NOT DETECTED Final   Klebsiella oxytoca NOT DETECTED NOT DETECTED Final   Klebsiella pneumoniae NOT DETECTED NOT DETECTED Final   Proteus species NOT DETECTED NOT DETECTED Final   Serratia marcescens NOT DETECTED NOT DETECTED Final   Carbapenem resistance NOT DETECTED NOT DETECTED Final   Haemophilus influenzae NOT DETECTED NOT DETECTED Final   Neisseria meningitidis NOT DETECTED NOT DETECTED Final   Pseudomonas aeruginosa NOT DETECTED NOT DETECTED Final   Candida albicans NOT DETECTED NOT DETECTED Final   Candida glabrata NOT DETECTED NOT DETECTED Final   Candida krusei NOT DETECTED NOT DETECTED Final   Candida parapsilosis NOT DETECTED NOT DETECTED Final   Candida tropicalis NOT DETECTED NOT DETECTED Final  Blood Culture (routine x 2)     Status: Abnormal   Collection Time: 11/07/15  5:24 PM  Result Value Ref Range Status   Specimen Description BLOOD RIGHT HAND  Final   Special Requests BOTTLES DRAWN AEROBIC AND ANAEROBIC 5CC  Final   Culture  Setup Time   Final    GRAM POSITIVE COCCI IN CLUSTERS IN BOTH AEROBIC AND ANAEROBIC BOTTLES CRITICAL RESULT CALLED TO, READ BACK BY AND VERIFIED WITH: J MARKEL 11/08/15 @ 0935 M VESTAL    Culture STAPHYLOCOCCUS AUREUS (A)  Final   Report Status 11/10/2015 FINAL  Final   Organism ID, Bacteria STAPHYLOCOCCUS AUREUS  Final      Susceptibility   Staphylococcus aureus - MIC*    CIPROFLOXACIN <=0.5 SENSITIVE Sensitive     ERYTHROMYCIN <=0.25 SENSITIVE Sensitive     GENTAMICIN <=0.5 SENSITIVE Sensitive     OXACILLIN <=0.25 SENSITIVE Sensitive     TETRACYCLINE >=16 RESISTANT Resistant     VANCOMYCIN <=0.5 SENSITIVE Sensitive     TRIMETH/SULFA <=10 SENSITIVE Sensitive     CLINDAMYCIN <=0.25 SENSITIVE Sensitive     RIFAMPIN <=0.5 SENSITIVE Sensitive     Inducible Clindamycin NEGATIVE Sensitive     * STAPHYLOCOCCUS AUREUS  MRSA  PCR  Screening     Status: None   Collection Time: 11/07/15 10:38 PM  Result Value Ref Range Status   MRSA by PCR NEGATIVE NEGATIVE Final    Comment:        The GeneXpert MRSA Assay (FDA approved for NASAL specimens only), is one component of a comprehensive MRSA colonization surveillance program. It is not intended to diagnose MRSA infection nor to guide or monitor treatment for MRSA infections.   Culture, blood (routine x 2)     Status: None (Preliminary result)   Collection Time: 11/09/15  7:05 AM  Result Value Ref Range Status   Specimen Description BLOOD LEFT ANTECUBITAL  Final   Special Requests IN PEDIATRIC BOTTLE  3CC  Final   Culture NO GROWTH 2 DAYS  Final   Report Status PENDING  Incomplete  Culture, blood (routine x 2)     Status: None (Preliminary result)   Collection Time: 11/09/15  7:15 AM  Result Value Ref Range Status   Specimen Description BLOOD LEFT HAND  Final   Special Requests IN PEDIATRIC BOTTLE  3CC  Final   Culture NO GROWTH 2 DAYS  Final   Report Status PENDING  Incomplete     Radiology Studies: US Renal  11/10/2015  CLINICAL DATA:  Fevers for 2 days, history of pyelonephritis EXAM: RENAL / URINARY TRACT ULTRASOUND COMPLETE COMPARISON:  11/07/2015 FINDINGS: Right Kidney: Length: 13.3 cm. Echogenicity within normal limits. No mass or hydronephrosis visualized. Left Kidney: Length: 12.6 cm. Echogenicity within normal limits. No mass or hydronephrosis visualized. Bladder: Appears normal for degree of bladder distention. Note made of small pleural effusions as well as very mild ascites. IMPRESSION: No focal abnormality is noted within the kidneys. Small pleural effusions and mild ascites. Electronically Signed   By: Alcide Clever M.D.   On: 11/10/2015 16:38    Scheduled Meds: . [START ON 11/12/2015] amLODipine  5 mg Oral Daily  .  ceFAZolin (ANCEF) IV  2 g Intravenous Q8H  . magnesium oxide  400 mg Oral BID  . sodium chloride flush  3 mL Intravenous Q12H    Continuous Infusions: . sodium chloride 125 mL/hr at 11/11/15 2212     LOS: 4 days    Time spent: 30 minutes.     Vassie Loll, MD [facility] Pager [phone]  If 7PM-7AM, please contact night-coverage [email or web address] Password [email or web address] 11/11/2015, 10:44 PM

## 2015-11-11 NOTE — Progress Notes (Signed)
Pharmacy Antibiotic Note  James KluverDonnald B Marshall is a 35 y.o. male admitted on 11/07/2015 with MSSA bacteremia and pyelonephritis. Pharmacy has been consulted for Ancef dosing.   Tmax 101, tc 99, WBC improved 21.3>9.8 , LA normalizing, PCT 1.17. Renal function remains stable, CrCl >100.  TTE negative for endocarditis, no abscess seen on renal US. Repeat cultures from 7/10 are no growth to date.  Plan: --Continue Ancef 2g IV every 8 hours for at least 2 weeks per Dr. Orvan Falconerampbell with ID --To have PICC line placed if 7/10 cultures remain negative --Given clinical improvement, stable renal function, and ID physician consultation in place, pharmacy will sign off. Please re-consult us if further assistance is needed  Height: 5\' 10"  (177.8 cm) Weight: 172 lb 9.9 oz (78.3 kg) IBW/kg (Calculated) : 73  Temp (24hrs), Avg:99.4 F (37.4 C), Min:98.2 F (36.8 C), Max:101 F (38.3 C)   Recent Labs Lab 11/07/15 1315  11/07/15 1638 11/07/15 2158 11/08/15 0017 11/08/15 0336 11/08/15 2243 11/09/15 0211 11/09/15 1105 11/10/15 0753 11/11/15 0629  WBC 11.5*  --   --   --   --  21.3* 18.5*  --  16.0*  --  9.8  CREATININE 0.79  --   --   --   --  0.89 0.87  --  0.76 0.79 0.79  LATICACIDVEN  --   < > 2.93* 1.2 0.9  --  2.0* 1.5  --   --   --   < > = values in this interval not displayed.  Estimated Creatinine Clearance: 131.8 mL/min (by C-G formula based on Cr of 0.79).    Allergies  Allergen Reactions  . Hydrocodone Nausea And Vomiting    Antimicrobials this admission: 7/8 Zosyn>>7/9 7/9 Ancef >>  Dose adjustments this admission: n/a  Microbiology results: 7/8 BCx x2 - MSSA 7/8 MRSA PCR - negative 7/8 UCx - >100K MSSA 7/10 BCx x2 - NGx1D  Thank you for allowing pharmacy to be a part of this patient's care.  Arcola JanskyMeagan Damel Querry, PharmD Clinical Pharmacist Phone: [phone] Pager: [phone]  11/11/2015 1:41 PM

## 2015-11-12 ENCOUNTER — Other Ambulatory Visit: Payer: Self-pay | Admitting: Urology

## 2015-11-12 DIAGNOSIS — A4101 Sepsis due to Methicillin susceptible Staphylococcus aureus: Principal | ICD-10-CM

## 2015-11-12 DIAGNOSIS — E876 Hypokalemia: Secondary | ICD-10-CM | POA: Insufficient documentation

## 2015-11-12 LAB — BASIC METABOLIC PANEL
Anion gap: 8 (ref 5–15)
CHLORIDE: 105 mmol/L (ref 101–111)
CO2: 22 mmol/L (ref 22–32)
Calcium: 8.1 mg/dL — ABNORMAL LOW (ref 8.9–10.3)
Creatinine, Ser: 0.66 mg/dL (ref 0.61–1.24)
GFR calc Af Amer: >60 mL/min (ref >60)
GFR calc non Af Amer: >60 mL/min (ref >60)
GLUCOSE: 110 mg/dL — AB (ref 65–99)
POTASSIUM: 3.4 mmol/L — AB (ref 3.5–5.1)
Sodium: 135 mmol/L (ref 135–145)

## 2015-11-12 LAB — MAGNESIUM: Magnesium: 1.4 mg/dL — ABNORMAL LOW (ref 1.7–2.4)

## 2015-11-12 MED ORDER — CEFAZOLIN SODIUM-DEXTROSE 2-4 GM/100ML-% IV SOLN: 2.0000 g | Freq: Three times a day (TID) | INTRAVENOUS | Status: AC

## 2015-11-12 MED ORDER — IBUPROFEN 600 MG PO TABS
600.0000 mg | ORAL_TABLET | Freq: Once | ORAL | Status: AC
  Administered 2015-11-12: 600 mg via ORAL
  Filled 2015-11-12: qty 1

## 2015-11-12 MED ORDER — PHENAZOPYRIDINE HCL 200 MG PO TABS: 200.0000 mg | ORAL_TABLET | Freq: Three times a day (TID) | ORAL | Status: DC | PRN

## 2015-11-12 MED ORDER — SODIUM CHLORIDE 0.9% FLUSH: 10.0000 mL | INTRAVENOUS | Status: DC | PRN

## 2015-11-12 MED ORDER — AMLODIPINE BESYLATE 5 MG PO TABS: 5.0000 mg | ORAL_TABLET | Freq: Every day | ORAL | Status: DC

## 2015-11-12 MED ORDER — IBUPROFEN 400 MG PO TABS
400.0000 mg | ORAL_TABLET | Freq: Three times a day (TID) | ORAL | Status: DC | PRN
Start: 2015-11-12 — End: 2019-12-05

## 2015-11-12 NOTE — Progress Notes (Signed)
Patient ID: James Marshall, male   DOB: 1980-10-25, 35 y.o.   MRN: [id number] 5 Days Post-Op  Subjective: James Marshall continues to improve.   He had a fever last night but only to 101.1 and he has no significant pain.   The renal US showed no abscess.  He remains on ancef for his staph sepsis.    ROS:  Review of Systems  Constitutional: Positive for fever and chills.  Gastrointestinal: Negative for nausea, vomiting and abdominal pain.  Genitourinary: Negative for dysuria and hematuria.  All other systems reviewed and are negative.   Anti-infectives: Anti-infectives    Start     Dose/Rate Route Frequency Ordered Stop   11/08/15 2200  ceFAZolin (ANCEF) IVPB 1 g/50 mL premix  Status:  Discontinued     1 g 100 mL/hr over 30 Minutes Intravenous Every 8 hours 11/08/15 1718 11/08/15 1721   11/08/15 2200  ceFAZolin (ANCEF) IVPB 2g/100 mL premix     2 g 200 mL/hr over 30 Minutes Intravenous Every 8 hours 11/08/15 1721     11/08/15 1600  ceFAZolin (ANCEF) IVPB 2g/100 mL premix     2 g 200 mL/hr over 30 Minutes Intravenous Every 8 hours 11/08/15 1214 11/08/15 1611   11/08/15 0100  piperacillin-tazobactam (ZOSYN) IVPB 3.375 g  Status:  Discontinued     3.375 g 12.5 mL/hr over 240 Minutes Intravenous Every 8 hours 11/07/15 1657 11/08/15 1203   11/07/15 1700  piperacillin-tazobactam (ZOSYN) IVPB 3.375 g     3.375 g 100 mL/hr over 30 Minutes Intravenous  Once 11/07/15 1646 11/07/15 1757      Current Facility-Administered Medications  Medication Dose Route Frequency Provider Last Rate Last Dose  . 0.9 %  sodium chloride infusion   Intravenous Continuous Michael Litter, MD 125 mL/hr at 11/11/15 2212    . acetaminophen (TYLENOL) tablet 650 mg  650 mg Oral Q4H PRN Kathlen Mody, MD   650 mg at 11/12/15 0122   Or  . acetaminophen (TYLENOL) suppository 650 mg  650 mg Rectal Q6H PRN Kathlen Mody, MD      . amLODipine (NORVASC) tablet 5 mg  5 mg Oral Daily Vassie Loll, MD      . ceFAZolin (ANCEF) IVPB  2g/100 mL premix  2 g Intravenous Q8H Emi Holes, RPH   2 g at 11/12/15 0523  . hydrALAZINE (APRESOLINE) injection 10 mg  10 mg Intravenous Q4H PRN Kathlen Mody, MD   10 mg at 11/10/15 1327  . HYDROmorphone (DILAUDID) injection 1 mg  1 mg Intravenous Q2H PRN Kathlen Mody, MD   1 mg at 11/12/15 0224  . ibuprofen (ADVIL,MOTRIN) tablet 400 mg  400 mg Oral Q4H PRN Kathlen Mody, MD   400 mg at 11/12/15 0224  . ketorolac (TORADOL) 30 MG/ML injection 30 mg  30 mg Intravenous Q6H PRN Bjorn Pippin, MD   30 mg at 11/08/15 1658  . LORazepam (ATIVAN) tablet 1 mg  1 mg Oral Q8H PRN Kathlen Mody, MD      . magnesium oxide (MAG-OX) tablet 400 mg  400 mg Oral BID Vassie Loll, MD   400 mg at 11/11/15 2202  . ondansetron (ZOFRAN) tablet 4 mg  4 mg Oral Q6H PRN Michael Litter, MD       Or  . ondansetron [facility]) injection 4 mg  4 mg Intravenous Q6H PRN Michael Litter, MD   4 mg at 11/11/15 0517  . phenazopyridine (PYRIDIUM) tablet 200 mg  200 mg Oral TID WC PRN Jonny Ruiz  Annabell HowellsWrenn, MD   200 mg at 11/08/15 2220  . sodium chloride flush (NS) 0.9 % injection 3 mL  3 mL Intravenous Q12H Michael LitterNikki Carter, MD   3 mL at 11/11/15 2203  . zolpidem (AMBIEN) tablet 10 mg  10 mg Oral QHS PRN Kathlen ModyVijaya Akula, MD   10 mg at 11/11/15 0144     Objective: Vital signs in last 24 hours: Temp:  [98.5 F (36.9 C)-101.1 F (38.4 C)] 98.8 F (37.1 C) (07/13 0602) Pulse Rate:  [65-108] 65 (07/13 0602) Resp:  [16-20] 18 (07/13 0602) BP: (143-163)/(85-102) 143/85 mmHg (07/13 0602) SpO2:  [95 %-98 %] 97 % (07/13 0602)  Intake/Output from previous day: 07/12 0701 - 07/13 0700 In: -  Out: 1650 [Urine:1650] Intake/Output this shift: Total I/O In: -  Out: 550 [Urine:550]   Physical Exam  Constitutional: He is oriented to person, place, and time and well-developed, well-nourished, and in no distress.  Cardiovascular: Normal rate and regular rhythm.   Pulmonary/Chest: Effort normal. No respiratory distress.  Abdominal: Soft. He exhibits no  distension. There is no tenderness. There is no guarding.  Musculoskeletal: Normal range of motion. He exhibits no edema or tenderness.  Neurological: He is alert and oriented to person, place, and time.  Skin: Skin is warm and dry.  Psychiatric: Mood and affect normal.    Lab Results:   Recent Labs  11/09/15 1105 11/11/15 0629  WBC 16.0* 9.8  HGB 13.8 11.9*  HCT 40.3 33.7*  PLT 150 200   BMET  Recent Labs  11/10/15 0753 11/11/15 0629  NA 135 135  K 3.1* 3.2*  CL 104 106  CO2 20* 19*  GLUCOSE 83 95  BUN 6 <5*  CREATININE 0.79 0.79  CALCIUM 8.2* 7.9*   PT/INR No results for input(s): LABPROT, INR in the last 72 hours. ABG No results for input(s): PHART, HCO3 in the last 72 hours.  Invalid input(s): PCO2, PO2  Studies/Results: [place] Renal  11/10/2015  CLINICAL DATA:  Fevers for 2 days, history of pyelonephritis EXAM: RENAL / URINARY TRACT ULTRASOUND COMPLETE COMPARISON:  11/07/2015 FINDINGS: Right Kidney: Length: 13.3 cm. Echogenicity within normal limits. No mass or hydronephrosis visualized. Left Kidney: Length: 12.6 cm. Echogenicity within normal limits. No mass or hydronephrosis visualized. Bladder: Appears normal for degree of bladder distention. Note made of small pleural effusions as well as very mild ascites. IMPRESSION: No focal abnormality is noted within the kidneys. Small pleural effusions and mild ascites. Electronically Signed   By: Alcide CleverMark  Lukens M.D.   On: 11/10/2015 16:38   ID notes reviewed.   Renal US films and report reviewed.   Recent labs reviewed.   Assessment and Plan: Right ureteral stone with staph sepsis with continued improvement on ancef with prior stenting.  Should be discharged soon on oral antibiotics.   We are working on getting him scheduled in the next 1-2 weeks for ureteroscopy to remove the stone.   I would recommend maintaining po abx coverage until that has been completed.        LOS: 5 days    Anner CreteWRENN,Danaya Geddis  J 11/12/2015 [phone]

## 2015-11-12 NOTE — Discharge Summary (Signed)
Physician Discharge Summary  James Marshall:[id number] DOB: 01-08-1981 DOA: 11/07/2015  PCP: No primary care provider on file.  Admit date: 11/07/2015 Discharge date: 11/12/2015  Time spent: 35 minutes  Recommendations for Outpatient Follow-up:  Repeat BMET to follow electrolytes and renal function Repeat CBC to follow Hgb trend and WBC's stability  Reassess Bp and adjust antihypertensive regimen as needed   Discharge Diagnoses:  Principal Problem:   Staphylococcus aureus bacteremia Active Problems:   Right ureteral stone   Sepsis (HCC)   Obstructive uropathy   Acute pyelonephritis   Normocytic anemia   Essential hypertension   Discharge Condition: stable and improved. Will discharge home with [facility] services for IV antibiotics and follow up with ID service and urology service as an outpatient.  Diet recommendation: heart healthy diet   Filed Weights   11/07/15 1308 11/09/15 1733  Weight: 79.379 kg (175 lb) 78.3 kg (172 lb 9.9 oz)    History of present illness:  As per H&P written by Dr. Montez Marshall on 11/07/15 35 y.o. gentleman with a history of prior pyelonephritis who presents to the ED tonight accompanied by multiple family members. He describes the acute onset of RLQ pain around 6pm last night. No specific triggers. He subsequently developed fever, sweats, shaking chills, and nonbloody emesis. He has noticed decreased urine output. Urine is concentrated but he denies dysuria. No known history of renal stones. No syncope/LOC. Advil did not alleviate his pain. He presented to the ED for further evaluation and treatment.   Admitted due to Sepsis from pyelonephritis and stone obstructive uropathy.  Hospital Course:  Sepsis from pyelonephritis from right ureteral stone and bacteremia: -Admitted to step down for IV antibiotics, urology consulted and underwent cystoscopy with -insertion of the right double J stent.  -Will continue PRN analgesics and patient has been advise to  maintain good hydration  -Plan is for ureteroscopy for stone removal on 7/20 by urology service (Dr. Annabell Howells).  -discharge on IV Ancef for another 14 days (this will cover him during after ureteroscopy) -Lactic acid normalized.  -WBC's WNL at discharge -Fever subsiding/resolving (even still some low grade intermittent appreciated) -Follow up with ID as an outpatient as well.  MSSA bacteremia -on IV ancef as per ID rec's -plan is to treat for another 14 days -2D echo w/o vegetations -given improvement in his symptoms; no TEE will be pursuit at this time -continue PRN antipyretics/analgesics -PICC line ordered/placed on 7/13 -repeat blood cx's remains neg at discharge  Essential HTN -will treat with amlodipine -heart healthy diet recommended -further adjustment to antihypertensive regimen to be done base on fluctuation of his VS  Normocytic anemia: -follow up CBC at follow up visit   -No signs of overt bleeding appreciated -Hgb remains stable.  Hypokalemia:  -repleted and magnesium oxide given -patient w/o nausea or vomiting and with good Po intake at discharge  Procedures:  Cystoscopy with insertion of right double-J stent   2-D echo: - Left ventricle: The cavity size was normal. Wall thickness was  normal. Systolic function was normal. The estimated ejection  fraction was in the range of 60% to 65%. Wall motion was normal;  there were no regional wall motion abnormalities. Left  ventricular diastolic function parameters were normal. - Mitral valve: There was mild regurgitation directed posteriorly. - Impressions: There was no evidence of a vegetation.  Impressions: - There was no evidence of a vegetation.  Consultations:  ID  Urology service   Discharge Exam: Filed Vitals:   11/12/15 1242  11/12/15 1400  BP:    Pulse:    Temp: 99.7 F (37.6 C) 99.5 F (37.5 C)  Resp:     General exam: low grade fever overnight; in no acute distress and denies CP,  SOB, nausea and vomiting. After discussing with ID, ok to discharge home on IV antibiotics for another 14 days. Respiratory system: Clear to auscultation. Respiratory effort normal. Cardiovascular system: S1 & S2 heard, RRR. No JVD, murmurs, rubs, gallops or clicks. No pedal edema. Gastrointestinal system: very mild right flank pain on deep palpation, positive BS, no distension and no guarding. Central nervous system: Alert and oriented. No focal neurological deficits. Extremities: Symmetric 5 x 5 power. Skin: No rashes, lesions or ulcers Psychiatry: Judgement and insight appear normal. Mood & affect appropriate.   Discharge Instructions   Discharge Instructions    Diet - low sodium heart healthy    Complete by:  As directed      Discharge instructions    Complete by:  As directed   Take medications as prescribed Follow up as instructed for ureteroscopy with urology service Please follow up with ID as an outpatient (office will set up appointment for you) Follow heart healthy diet Keep yourself well hydrated (at least half your weight in ounces of water everyday)          Current Discharge Medication List    START taking these medications   Details  amLODipine (NORVASC) 5 MG tablet Take 1 tablet (5 mg total) by mouth daily. Qty: 30 tablet, Refills: 1    ceFAZolin (ANCEF) 2-4 GM/100ML-% IVPB Inject 100 mLs (2 g total) into the vein every 8 (eight) hours. Qty: 8400 mL, Refills: 0    ibuprofen (ADVIL,MOTRIN) 400 MG tablet Take 1 tablet (400 mg total) by mouth every 8 (eight) hours as needed for fever, headache or moderate pain. Qty: 40 tablet, Refills: 0    phenazopyridine (PYRIDIUM) 200 MG tablet Take 1 tablet (200 mg total) by mouth 3 (three) times daily with meals as needed (urethral pain and burning.). Qty: 20 tablet, Refills: 0       Allergies  Allergen Reactions  . Hydrocodone Nausea And Vomiting   Follow-up Information    Follow up with Anner Crete, MD.    Specialty:  Urology   Why:  My office will call to arrange the next procedure.    Contact information:   [address] [place] [address] [phone]       Follow up with [facility].   Why:  For home nurse to assist with IV antibiotics   Contact information:   [address] [place] [address] [phone]       Follow up with Cliffton Asters, MD.   Specialty:  Infectious Diseases   Why:  office will contcat you with appointment details    Contact information:   [address] [place] [address] [phone]       The results of significant diagnostics from this hospitalization (including imaging, microbiology, ancillary and laboratory) are listed below for reference.    Significant Diagnostic Studies: Ct Abdomen Pelvis W Contrast  11/07/2015  CLINICAL DATA:  Right lower quadrant pain for 2 days. Vomiting and diarrhea. EXAM: CT ABDOMEN AND PELVIS WITH CONTRAST TECHNIQUE: Multidetector CT imaging of the abdomen and pelvis was performed using the standard protocol following bolus administration of intravenous contrast. CONTRAST:  1 ISOVUE-300 IOPAMIDOL (ISOVUE-300) INJECTION 61% COMPARISON:  Multiple CT scans since Sep 01, 2003 FINDINGS: A few thin walled lung cysts are seen in the right lung base with no solid components. No suspicious nodules or masses. The lung bases are otherwise normal. No free air or free fluid. Bilateral renal stones are identified with the largest seen on the left measuring 6.7 mm on series 5, image 91. There is very mild stranding adjacent to the right kidney in addition to moderate hydronephrosis, not seen previously. The right ureter is prominent in caliber along its length with a 3.5 mm stone in the distal right ureter just above the UVJ. There is mild thickening of the ureteral wall at the level of the right distal ureteral stone, consistent with inflammation. Additionally, there is an ill-defined region of low  attenuation in the anterior right kidney as seen on series 2, image 31 with no definitive focal adjacent fat stranding. This low-attenuation measures 2.6 by 1.4 cm. A few tiny low-attenuation lesions in both kidneys are likely cysts but too small to characterize. No other suspicious renal abnormalities. There is hepatic steatosis. The liver, gallbladder, portal vein, spleen, adrenal glands, and pancreas are within normal limits. The abdominal aorta is normal in appearance. No adenopathy. The stomach and small bowel are normal. The colon and visualized appendix are normal. The pelvis demonstrates a distal right ureteral stone as described above. No adenopathy or mass identified the pelvis. The prostate, seminal vesicles, and bladder are normal in appearance. The visualized bones are unremarkable. Delayed images demonstrate no filling defects in the upper renal collecting systems bilaterally. There is appropriate excretion into the right renal collecting system. IMPRESSION: 1. Obstructing stone in the distal right ureter measuring 3.5 mm with moderate hydronephrosis and mild perinephric stranding. The region of the ill defined low-attenuation in the anterior right kidney could represent developing focal pyelonephritis. No discrete abscess seen on this study. Recommend clinical correlation and follow-up. 2. Multiple other bilateral renal stones. Electronically Signed   By: Gerome Sam III M.D   On: 11/07/2015 18:38   Dg Cystogram  11/07/2015  CLINICAL DATA:  Acute pyelonephritis EXAM: CYSTOGRAM TECHNIQUE: Cystoscopy was performed by the urologist. Spot intraoperative fluoroscopic images were provided. FLUOROSCOPY TIME:  9 seconds COMPARISON:  CT abdomen pelvis dated 11/07/2015 FINDINGS: Frontal and lateral spot fluoroscopic images have been provided during the procedure. Contrast is present in the bladder. A right ureteral stent is incompletely visualized. IMPRESSION: Intraoperative fluoroscopic images during  cystoscopy, as described above. Electronically Signed   By: Charline Bills M.D.   On: 11/07/2015 21:35   US Renal  11/10/2015  CLINICAL DATA:  Fevers for 2 days, history of pyelonephritis EXAM: RENAL / URINARY TRACT ULTRASOUND COMPLETE COMPARISON:  11/07/2015 FINDINGS: Right Kidney: Length: 13.3 cm. Echogenicity within normal limits. No mass or hydronephrosis visualized. Left Kidney: Length: 12.6 cm. Echogenicity within normal limits. No mass or hydronephrosis visualized. Bladder: Appears normal for degree of bladder distention. Note made of small pleural effusions as well as very mild ascites. IMPRESSION: No focal abnormality is noted within the kidneys. Small pleural effusions and mild ascites. Electronically Signed   By: Alcide Clever M.D.   On: 11/10/2015 16:38   Dg Chest Port 1 View  11/07/2015  CLINICAL DATA:  Acute onset of sepsis.  Initial encounter. EXAM: PORTABLE CHEST 1 VIEW COMPARISON:  Thoracic spine radiographs performed 03/21/2009 FINDINGS: The lungs are well-aerated. Mild vascular congestion is suggested. There is no evidence of focal opacification, pleural effusion or pneumothorax. The cardiomediastinal silhouette is borderline normal in size. No acute osseous abnormalities  are seen. IMPRESSION: Mild vascular congestion suggested.  Lungs remain grossly clear. Electronically Signed   By: Roanna Raider M.D.   On: 11/07/2015 22:47    Microbiology: Recent Results (from the past 240 hour(s))  Urine culture     Status: Abnormal   Collection Time: 11/07/15  1:14 PM  Result Value Ref Range Status   Specimen Description URINE, RANDOM  Final   Special Requests NONE  Final   Culture >=100,000 COLONIES/mL STAPHYLOCOCCUS AUREUS (A)  Final   Report Status 11/09/2015 FINAL  Final   Organism ID, Bacteria STAPHYLOCOCCUS AUREUS (A)  Final      Susceptibility   Staphylococcus aureus - MIC*    CIPROFLOXACIN <=0.5 SENSITIVE Sensitive     GENTAMICIN <=0.5 SENSITIVE Sensitive     NITROFURANTOIN  <=16 SENSITIVE Sensitive     OXACILLIN <=0.25 SENSITIVE Sensitive     TETRACYCLINE >=16 RESISTANT Resistant     VANCOMYCIN <=0.5 SENSITIVE Sensitive     TRIMETH/SULFA <=10 SENSITIVE Sensitive     CLINDAMYCIN <=0.25 SENSITIVE Sensitive     RIFAMPIN <=0.5 SENSITIVE Sensitive     Inducible Clindamycin NEGATIVE Sensitive     * >=100,000 COLONIES/mL STAPHYLOCOCCUS AUREUS  Blood Culture (routine x 2)     Status: Abnormal   Collection Time: 11/07/15  5:15 PM  Result Value Ref Range Status   Specimen Description BLOOD RIGHT ANTECUBITAL  Final   Special Requests BOTTLES DRAWN AEROBIC AND ANAEROBIC 5CC  Final   Culture  Setup Time   Final    GRAM POSITIVE COCCI IN CLUSTERS IN BOTH AEROBIC AND ANAEROBIC BOTTLES CRITICAL RESULT CALLED TO, READ BACK BY AND VERIFIED WITH: J MARKEL 11/08/15 @ 0935 M VESTAL    Culture (A)  Final    STAPHYLOCOCCUS AUREUS SUSCEPTIBILITIES PERFORMED ON PREVIOUS CULTURE WITHIN THE LAST 5 DAYS.    Report Status 11/10/2015 FINAL  Final  Blood Culture ID Panel (Reflexed)     Status: Abnormal   Collection Time: 11/07/15  5:15 PM  Result Value Ref Range Status   Enterococcus species NOT DETECTED NOT DETECTED Final   Vancomycin resistance NOT DETECTED NOT DETECTED Final   Listeria monocytogenes NOT DETECTED NOT DETECTED Final   Staphylococcus species DETECTED (A) NOT DETECTED Final    Comment: CRITICAL RESULT CALLED TO, READ BACK BY AND VERIFIED WITH: J MARKEL 11/08/15 @ 0935 M VESTAL    Staphylococcus aureus DETECTED (A) NOT DETECTED Final    Comment: CRITICAL RESULT CALLED TO, READ BACK BY AND VERIFIED WITH: J MARKEL 11/08/15 @ 0935 M VESTAL    Methicillin resistance NOT DETECTED NOT DETECTED Final   Streptococcus species NOT DETECTED NOT DETECTED Final   Streptococcus agalactiae NOT DETECTED NOT DETECTED Final   Streptococcus pneumoniae NOT DETECTED NOT DETECTED Final   Streptococcus pyogenes NOT DETECTED NOT DETECTED Final   Acinetobacter baumannii NOT DETECTED NOT  DETECTED Final   Enterobacteriaceae species NOT DETECTED NOT DETECTED Final   Enterobacter cloacae complex NOT DETECTED NOT DETECTED Final   Escherichia coli NOT DETECTED NOT DETECTED Final   Klebsiella oxytoca NOT DETECTED NOT DETECTED Final   Klebsiella pneumoniae NOT DETECTED NOT DETECTED Final   Proteus species NOT DETECTED NOT DETECTED Final   Serratia marcescens NOT DETECTED NOT DETECTED Final   Carbapenem resistance NOT DETECTED NOT DETECTED Final   Haemophilus influenzae NOT DETECTED NOT DETECTED Final   Neisseria meningitidis NOT DETECTED NOT DETECTED Final   Pseudomonas aeruginosa NOT DETECTED NOT DETECTED Final   Candida albicans NOT DETECTED NOT DETECTED  Final   Candida glabrata NOT DETECTED NOT DETECTED Final   Candida krusei NOT DETECTED NOT DETECTED Final   Candida parapsilosis NOT DETECTED NOT DETECTED Final   Candida tropicalis NOT DETECTED NOT DETECTED Final  Blood Culture (routine x 2)     Status: Abnormal   Collection Time: 11/07/15  5:24 PM  Result Value Ref Range Status   Specimen Description BLOOD RIGHT HAND  Final   Special Requests BOTTLES DRAWN AEROBIC AND ANAEROBIC 5CC  Final   Culture  Setup Time   Final    GRAM POSITIVE COCCI IN CLUSTERS IN BOTH AEROBIC AND ANAEROBIC BOTTLES CRITICAL RESULT CALLED TO, READ BACK BY AND VERIFIED WITH: Cleotilde NeerJ MARKEL 11/08/15 @ 0935 M VESTAL    Culture STAPHYLOCOCCUS AUREUS (A)  Final   Report Status 11/10/2015 FINAL  Final   Organism ID, Bacteria STAPHYLOCOCCUS AUREUS  Final      Susceptibility   Staphylococcus aureus - MIC*    CIPROFLOXACIN <=0.5 SENSITIVE Sensitive     ERYTHROMYCIN <=0.25 SENSITIVE Sensitive     GENTAMICIN <=0.5 SENSITIVE Sensitive     OXACILLIN <=0.25 SENSITIVE Sensitive     TETRACYCLINE >=16 RESISTANT Resistant     VANCOMYCIN <=0.5 SENSITIVE Sensitive     TRIMETH/SULFA <=10 SENSITIVE Sensitive     CLINDAMYCIN <=0.25 SENSITIVE Sensitive     RIFAMPIN <=0.5 SENSITIVE Sensitive     Inducible Clindamycin  NEGATIVE Sensitive     * STAPHYLOCOCCUS AUREUS  MRSA PCR Screening     Status: None   Collection Time: 11/07/15 10:38 PM  Result Value Ref Range Status   MRSA by PCR NEGATIVE NEGATIVE Final    Comment:        The GeneXpert MRSA Assay (FDA approved for NASAL specimens only), is one component of a comprehensive MRSA colonization surveillance program. It is not intended to diagnose MRSA infection nor to guide or monitor treatment for MRSA infections.   Culture, blood (routine x 2)     Status: None (Preliminary result)   Collection Time: 11/09/15  7:05 AM  Result Value Ref Range Status   Specimen Description BLOOD LEFT ANTECUBITAL  Final   Special Requests IN PEDIATRIC BOTTLE  3CC  Final   Culture NO GROWTH 3 DAYS  Final   Report Status PENDING  Incomplete  Culture, blood (routine x 2)     Status: None (Preliminary result)   Collection Time: 11/09/15  7:15 AM  Result Value Ref Range Status   Specimen Description BLOOD LEFT HAND  Final   Special Requests IN PEDIATRIC BOTTLE  3CC  Final   Culture NO GROWTH 3 DAYS  Final   Report Status PENDING  Incomplete     Labs: Basic Metabolic Panel:  Recent Labs Lab 11/08/15 2243 11/09/15 1105 11/10/15 0753 11/10/15 1715 11/11/15 0629 11/12/15 0608  NA 133* 135 135  --  135 135  K 3.4* 3.6 3.1*  --  3.2* 3.4*  CL 101 104 104  --  106 105  CO2 21* 24 20*  --  19* 22  GLUCOSE 112* 89 83  --  95 110*  BUN 9 6 6   --  <5* <5*  CREATININE 0.87 0.76 0.79  --  0.79 0.66  CALCIUM 8.4* 8.4* 8.2*  --  7.9* 8.1*  MG  --   --   --  1.5*  --  1.4*   Liver Function Tests:  Recent Labs Lab 11/07/15 1315  AST 28  ALT 21  ALKPHOS 64  BILITOT 0.8  PROT 7.3  ALBUMIN 4.3    Recent Labs Lab 11/07/15 1315  LIPASE 25   CBC:  Recent Labs Lab 11/07/15 1315 11/08/15 0336 11/08/15 2243 11/09/15 1105 11/11/15 0629  WBC 11.5* 21.3* 18.5* 16.0* 9.8  HGB 14.8 11.6* 14.7 13.8 11.9*  HCT 41.8 34.4* 42.8 40.3 33.7*  MCV 91.3 94.0 93.4  92.4 88.9  PLT 307 193 174 150 200    Signed:  Vassie Loll MD.  [facility] 11/12/2015, 5:38 PM

## 2015-11-12 NOTE — Progress Notes (Signed)
[facility]  Patient Status:  New pt for [facility] this admission  [facility] is providing the following services: HHRN and Home Infusion Pharmacy team for home IV ABX.  [facility] hospital infusion coordinator has met with pt and will provide hands on teaching prior to DC home. Pt is very capable of self administration.  Pt concerned about copays for home IV ABX. Shared his copays based on current BCBS coverage and his OOP and he feels he can handle. [facility] will be prepared to support DC to home when ordered.   If patient discharges after hours, please call [phone].   Larry Sierras 11/12/2015, 12:50 PM

## 2015-11-12 NOTE — Progress Notes (Signed)
Peripherally Inserted Central Catheter/Midline Placement  The IV Nurse has discussed with the patient and/or persons authorized to consent for the patient, the purpose of this procedure and the potential benefits and risks involved with this procedure.  The benefits include less needle sticks, lab draws from the catheter and patient may be discharged home with the catheter.  Risks include, but not limited to, infection, bleeding, blood clot (thrombus formation), and puncture of an artery; nerve damage and irregular heat beat.  Alternatives to this procedure were also discussed.  PICC/Midline Placement Documentation  PICC Single Lumen 11/12/15 PICC Right Basilic 41 cm 1 cm (Active)  Indication for Insertion or Continuance of Line Home intravenous therapies (PICC only) 11/12/2015  6:00 PM  Exposed Catheter (cm) 0 cm 11/12/2015  6:00 PM  Dressing Change Due 11/19/15 11/12/2015  6:00 PM       Franne GripNewman, Collyn Ribas Renee 11/12/2015, 6:08 PM

## 2015-11-12 NOTE — Progress Notes (Signed)
Patient ID: James Marshall, male   DOB: 1980/05/19, 35 y.o.   MRN: [id number]         [facility]    Date of Admission:  11/07/2015           Day 5 antibiotics  Principal Problem:   Staphylococcus aureus bacteremia Active Problems:   Sepsis (HCC)   Acute pyelonephritis   Right ureteral stone   Obstructive uropathy   Normocytic anemia   Essential hypertension   . amLODipine  5 mg Oral Daily  .  ceFAZolin (ANCEF) IV  2 g Intravenous Q8H  . magnesium oxide  400 mg Oral BID  . sodium chloride flush  3 mL Intravenous Q12H    SUBJECTIVE: He had fever to 101.1 last night but overall is feeling much better and is eager to go home. He is no longer having any abdominal or flank pain.  Review of Systems: Review of Systems  Constitutional: Positive for fever, chills and malaise/fatigue. Negative for diaphoresis.  Gastrointestinal: Negative for nausea, vomiting, abdominal pain and diarrhea.  Genitourinary: Negative for flank pain.  Neurological: Negative for headaches.    Past Medical History  Diagnosis Date  . Acute pyelonephritis     Social History  Substance Use Topics  . Smoking status: Former Games developer  . Smokeless tobacco: None  . Alcohol Use: Yes     Comment: 2-3 times per week, no history of EtOH withdrawal    Family History  Problem Relation Age of Onset  . Cervical cancer Mother   . Diabetes Paternal Grandfather   . Prostate cancer Paternal Grandfather   . Heart disease Maternal Grandfather    Allergies  Allergen Reactions  . Hydrocodone Nausea And Vomiting    OBJECTIVE: Filed Vitals:   11/12/15 0357 11/12/15 0508 11/12/15 0602 11/12/15 0948  BP:  148/93 143/85 151/85  Pulse:  68 65 83  Temp: 99.2 F (37.3 C) 99 F (37.2 C) 98.8 F (37.1 C)   TempSrc: Oral Oral Oral   Resp:  18 18   Height:      Weight:      SpO2:  95% 97% 98%   Body mass index is 24.77 kg/(m^2).  Physical Exam  Constitutional: He is oriented to  person, place, and time.  He is smiling and in good spirits.  Cardiovascular: Normal rate and regular rhythm.   No murmur heard. Pulmonary/Chest: Effort normal and breath sounds normal.  Abdominal: Soft. There is no tenderness.  Neurological: He is alert and oriented to person, place, and time.  Skin: No rash noted.  Psychiatric: Mood and affect normal.    Lab Results Lab Results  Component Value Date   WBC 9.8 11/11/2015   HGB 11.9* 11/11/2015   HCT 33.7* 11/11/2015   MCV 88.9 11/11/2015   PLT 200 11/11/2015    Lab Results  Component Value Date   CREATININE 0.66 11/12/2015   BUN <5* 11/12/2015   NA 135 11/12/2015   K 3.4* 11/12/2015   CL 105 11/12/2015   CO2 22 11/12/2015    Lab Results  Component Value Date   ALT 21 11/07/2015   AST 28 11/07/2015   ALKPHOS 64 11/07/2015   BILITOT 0.8 11/07/2015     Microbiology: Recent Results (from the past 240 hour(s))  Urine culture     Status: Abnormal   Collection Time: 11/07/15  1:14 PM  Result Value Ref Range Status   Specimen Description URINE, RANDOM  Final   Special  Requests NONE  Final   Culture >=100,000 COLONIES/mL STAPHYLOCOCCUS AUREUS (A)  Final   Report Status 11/09/2015 FINAL  Final   Organism ID, Bacteria STAPHYLOCOCCUS AUREUS (A)  Final      Susceptibility   Staphylococcus aureus - MIC*    CIPROFLOXACIN <=0.5 SENSITIVE Sensitive     GENTAMICIN <=0.5 SENSITIVE Sensitive     NITROFURANTOIN <=16 SENSITIVE Sensitive     OXACILLIN <=0.25 SENSITIVE Sensitive     TETRACYCLINE >=16 RESISTANT Resistant     VANCOMYCIN <=0.5 SENSITIVE Sensitive     TRIMETH/SULFA <=10 SENSITIVE Sensitive     CLINDAMYCIN <=0.25 SENSITIVE Sensitive     RIFAMPIN <=0.5 SENSITIVE Sensitive     Inducible Clindamycin NEGATIVE Sensitive     * >=100,000 COLONIES/mL STAPHYLOCOCCUS AUREUS  Blood Culture (routine x 2)     Status: Abnormal   Collection Time: 11/07/15  5:15 PM  Result Value Ref Range Status   Specimen Description BLOOD RIGHT  ANTECUBITAL  Final   Special Requests BOTTLES DRAWN AEROBIC AND ANAEROBIC 5CC  Final   Culture  Setup Time   Final    GRAM POSITIVE COCCI IN CLUSTERS IN BOTH AEROBIC AND ANAEROBIC BOTTLES CRITICAL RESULT CALLED TO, READ BACK BY AND VERIFIED WITH: J MARKEL 11/08/15 @ 0935 M VESTAL    Culture (A)  Final    STAPHYLOCOCCUS AUREUS SUSCEPTIBILITIES PERFORMED ON PREVIOUS CULTURE WITHIN THE LAST 5 DAYS.    Report Status 11/10/2015 FINAL  Final  Blood Culture ID Panel (Reflexed)     Status: Abnormal   Collection Time: 11/07/15  5:15 PM  Result Value Ref Range Status   Enterococcus species NOT DETECTED NOT DETECTED Final   Vancomycin resistance NOT DETECTED NOT DETECTED Final   Listeria monocytogenes NOT DETECTED NOT DETECTED Final   Staphylococcus species DETECTED (A) NOT DETECTED Final    Comment: CRITICAL RESULT CALLED TO, READ BACK BY AND VERIFIED WITH: J MARKEL 11/08/15 @ 0935 M VESTAL    Staphylococcus aureus DETECTED (A) NOT DETECTED Final    Comment: CRITICAL RESULT CALLED TO, READ BACK BY AND VERIFIED WITH: J MARKEL 11/08/15 @ 0935 M VESTAL    Methicillin resistance NOT DETECTED NOT DETECTED Final   Streptococcus species NOT DETECTED NOT DETECTED Final   Streptococcus agalactiae NOT DETECTED NOT DETECTED Final   Streptococcus pneumoniae NOT DETECTED NOT DETECTED Final   Streptococcus pyogenes NOT DETECTED NOT DETECTED Final   Acinetobacter baumannii NOT DETECTED NOT DETECTED Final   Enterobacteriaceae species NOT DETECTED NOT DETECTED Final   Enterobacter cloacae complex NOT DETECTED NOT DETECTED Final   Escherichia coli NOT DETECTED NOT DETECTED Final   Klebsiella oxytoca NOT DETECTED NOT DETECTED Final   Klebsiella pneumoniae NOT DETECTED NOT DETECTED Final   Proteus species NOT DETECTED NOT DETECTED Final   Serratia marcescens NOT DETECTED NOT DETECTED Final   Carbapenem resistance NOT DETECTED NOT DETECTED Final   Haemophilus influenzae NOT DETECTED NOT DETECTED Final    Neisseria meningitidis NOT DETECTED NOT DETECTED Final   Pseudomonas aeruginosa NOT DETECTED NOT DETECTED Final   Candida albicans NOT DETECTED NOT DETECTED Final   Candida glabrata NOT DETECTED NOT DETECTED Final   Candida krusei NOT DETECTED NOT DETECTED Final   Candida parapsilosis NOT DETECTED NOT DETECTED Final   Candida tropicalis NOT DETECTED NOT DETECTED Final  Blood Culture (routine x 2)     Status: Abnormal   Collection Time: 11/07/15  5:24 PM  Result Value Ref Range Status   Specimen Description BLOOD RIGHT HAND  Final   Special Requests BOTTLES DRAWN AEROBIC AND ANAEROBIC 5CC  Final   Culture  Setup Time   Final    GRAM POSITIVE COCCI IN CLUSTERS IN BOTH AEROBIC AND ANAEROBIC BOTTLES CRITICAL RESULT CALLED TO, READ BACK BY AND VERIFIED WITH: J MARKEL 11/08/15 @ 0935 M VESTAL    Culture STAPHYLOCOCCUS AUREUS (A)  Final   Report Status 11/10/2015 FINAL  Final   Organism ID, Bacteria STAPHYLOCOCCUS AUREUS  Final      Susceptibility   Staphylococcus aureus - MIC*    CIPROFLOXACIN <=0.5 SENSITIVE Sensitive     ERYTHROMYCIN <=0.25 SENSITIVE Sensitive     GENTAMICIN <=0.5 SENSITIVE Sensitive     OXACILLIN <=0.25 SENSITIVE Sensitive     TETRACYCLINE >=16 RESISTANT Resistant     VANCOMYCIN <=0.5 SENSITIVE Sensitive     TRIMETH/SULFA <=10 SENSITIVE Sensitive     CLINDAMYCIN <=0.25 SENSITIVE Sensitive     RIFAMPIN <=0.5 SENSITIVE Sensitive     Inducible Clindamycin NEGATIVE Sensitive     * STAPHYLOCOCCUS AUREUS  MRSA PCR Screening     Status: None   Collection Time: 11/07/15 10:38 PM  Result Value Ref Range Status   MRSA by PCR NEGATIVE NEGATIVE Final    Comment:        The GeneXpert MRSA Assay (FDA approved for NASAL specimens only), is one component of a comprehensive MRSA colonization surveillance program. It is not intended to diagnose MRSA infection nor to guide or monitor treatment for MRSA infections.   Culture, blood (routine x 2)     Status: None (Preliminary  result)   Collection Time: 11/09/15  7:05 AM  Result Value Ref Range Status   Specimen Description BLOOD LEFT ANTECUBITAL  Final   Special Requests IN PEDIATRIC BOTTLE  3CC  Final   Culture NO GROWTH 2 DAYS  Final   Report Status PENDING  Incomplete  Culture, blood (routine x 2)     Status: None (Preliminary result)   Collection Time: 11/09/15  7:15 AM  Result Value Ref Range Status   Specimen Description BLOOD LEFT HAND  Final   Special Requests IN PEDIATRIC BOTTLE  3CC  Final   Culture NO GROWTH 2 DAYS  Final   Report Status PENDING  Incomplete     ASSESSMENT: He is improving slowly on therapy for MSSA pyelonephritis and bacteremia. He continues to have intermittent fevers but his fever curve is improving his blood cultures are negative at 72 hours so it is safe to go ahead and have his PICC placed. I would recommend continuing cefazolin minimum of 2 weeks, but make sure that it is continued through his ureteroscopy.   PLAN: 1. Continue cefazolin 2. PICC placement   Cliffton Asters, MD [facility] [phone] pager   [phone] cell 11/12/2015, 11:54 AM

## 2015-11-13 NOTE — Progress Notes (Signed)
NURSING PROGRESS NOTE  James Marshall [id number] Discharge Data: 11/13/2015 5:02 AM Attending Provider: No att. providers found PCP:No primary care provider on file.     [name] to be D/C'd Home per MD order.  Discussed with the patient the After Visit Summary and all questions fully answered. All IV's discontinued with no bleeding noted. All belongings returned to patient for patient to take home.   Last Vital Signs:  Blood pressure 164/104, pulse 80, temperature 99.3 F (37.4 C), temperature source Oral, resp. rate 19, height 5\' 10"  (1.778 m), weight 78.3 kg (172 lb 9.9 oz), SpO2 98 %.  Discharge Medication List   Medication List    TAKE these medications        amLODipine 5 MG tablet  Commonly known as:  NORVASC  Take 1 tablet (5 mg total) by mouth daily.     ceFAZolin 2-4 GM/100ML-% IVPB  Commonly known as:  ANCEF  Inject 100 mLs (2 g total) into the vein every 8 (eight) hours.     ibuprofen 400 MG tablet  Commonly known as:  ADVIL,MOTRIN  Take 1 tablet (400 mg total) by mouth every 8 (eight) hours as needed for fever, headache or moderate pain.     phenazopyridine 200 MG tablet  Commonly known as:  PYRIDIUM  Take 1 tablet (200 mg total) by mouth 3 (three) times daily with meals as needed (urethral pain and burning.).

## 2015-11-14 LAB — CULTURE, BLOOD (ROUTINE X 2)
CULTURE: NO GROWTH
Culture: NO GROWTH

## 2015-11-16 NOTE — Progress Notes (Signed)
BMP 11/12/15 CBC 11/11/15 ekg 7/17 Chest 7/17 eccho 7/17   ALL IN EPIC

## 2015-11-16 NOTE — Patient Instructions (Addendum)
James Marshall  11/16/2015   Your procedure is scheduled on: 11/19/15 THURSDAY  [facility]  [facility] to 3rd floor to  Short Stay Center at  1045 AM.  Call this number if you have problems the morning of surgery [phone]   Remember: ONLY 1 PERSON MAY GO WITH YOU TO SHORT STAY TO GET  READY MORNING OF YOUR SURGERY.  Do not eat food or drink liquids :After Midnight.     Take these medicines the morning of surgery with A SIP OF WATER: AMLODIPINE             MAY TAKE PYRIDIUM IF NEEDED                                You may not have any metal on your body including hair pins and              piercings  Do not wear jewelry, make-up, lotions, powders or perfumes, deodorant             Do not wear nail polish.  Do not shave  48 hours prior to surgery.              Men may shave face and neck.   Do not bring valuables to the hospital.  IS NOT             RESPONSIBLE   FOR VALUABLES.  Contacts, dentures or bridgework may not be worn into surgery.  Leave suitcase in the car. After surgery it may be brought to your room.     Patients discharged the day of surgery will not be allowed to drive home.  Name and phone number of your driver:  James MouldsVanniesha-  Girlfriend  [phone]  Special Instructions: N/A              Please read over the following fact sheets you were given: _____________________________________________________________________             [facility] - Preparing for Surgery Before surgery, you can play an important role.  Because skin is not sterile, your skin needs to be as free of germs as possible.  You can reduce the number of germs on your skin by washing with CHG (chlorahexidine gluconate) soap before surgery.  CHG is an antiseptic cleaner which kills germs and bonds with the skin to continue killing germs even after washing. Please DO NOT use if you have an allergy to CHG or antibacterial soaps.  If  your skin becomes reddened/irritated stop using the CHG and inform your nurse when you arrive at Short Stay. Do not shave (including legs and underarms) for at least 48 hours prior to the first CHG shower.  You may shave your face/neck. Please follow these instructions carefully:  1.  Shower with CHG Soap the night before surgery and the  morning of Surgery.  2.  If you choose to wash your hair, wash your hair first as usual with your  normal  shampoo.  3.  After you shampoo, rinse your hair and body thoroughly to remove the  shampoo.                           4.  Use CHG as you would any other  liquid soap.  You can apply chg directly  to the skin and wash                       Gently with a scrungie or clean washcloth.  5.  Apply the CHG Soap to your body ONLY FROM THE NECK DOWN.   Do not use on face/ open                           Wound or open sores. Avoid contact with eyes, ears mouth and genitals (private parts).                       Wash face,  Genitals (private parts) with your normal soap.             6.  Wash thoroughly, paying special attention to the area where your surgery  will be performed.  7.  Thoroughly rinse your body with warm water from the neck down.  8.  DO NOT shower/wash with your normal soap after using and rinsing off  the CHG Soap.                9.  Pat yourself dry with a clean towel.            10.  Wear clean pajamas.            11.  Place clean sheets on your bed the night of your first shower and do not  sleep with pets. Day of Surgery : Do not apply any lotions/deodorants the morning of surgery.  Please wear clean clothes to the hospital/surgery center.  FAILURE TO FOLLOW THESE INSTRUCTIONS MAY RESULT IN THE CANCELLATION OF YOUR SURGERY PATIENT SIGNATURE_________________________________  NURSE SIGNATURE__________________________________  ________________________________________________________________________

## 2015-11-17 ENCOUNTER — Encounter (HOSPITAL_COMMUNITY)
Admission: RE | Admit: 2015-11-17 | Discharge: 2015-11-17 | Disposition: A | Discharge status: Home / Self Care | Payer: BC Managed Care – PPO | Source: Ambulatory Visit | Attending: Urology | Admitting: Urology

## 2015-11-17 ENCOUNTER — Encounter (HOSPITAL_COMMUNITY): Payer: Self-pay

## 2015-11-17 DIAGNOSIS — Z87891 Personal history of nicotine dependence: Secondary | ICD-10-CM | POA: Diagnosis not present

## 2015-11-17 DIAGNOSIS — N132 Hydronephrosis with renal and ureteral calculous obstruction: Secondary | ICD-10-CM | POA: Diagnosis present

## 2015-11-17 DIAGNOSIS — I10 Essential (primary) hypertension: Secondary | ICD-10-CM | POA: Diagnosis not present

## 2015-11-17 HISTORY — DX: Essential (primary) hypertension: I10

## 2015-11-17 HISTORY — DX: Sepsis, unspecified organism: A41.9

## 2015-11-17 HISTORY — DX: Anemia, unspecified: D64.9

## 2015-11-17 HISTORY — DX: Gastro-esophageal reflux disease without esophagitis: K21.9

## 2015-11-17 LAB — CBC
HEMATOCRIT: 41.1 % (ref 39.0–52.0)
Hemoglobin: 13.7 g/dL (ref 13.0–17.0)
MCH: 31.2 pg (ref 26.0–34.0)
MCHC: 33.3 g/dL (ref 30.0–36.0)
MCV: 93.6 fL (ref 78.0–100.0)
Platelets: 658 10*3/uL — ABNORMAL HIGH (ref 150–400)
RBC: 4.39 MIL/uL (ref 4.22–5.81)
RDW: 12.4 % (ref 11.5–15.5)
WBC: 9.3 10*3/uL (ref 4.0–10.5)

## 2015-11-17 LAB — BASIC METABOLIC PANEL
Anion gap: 9 (ref 5–15)
BUN: 14 mg/dL (ref 6–20)
CHLORIDE: 103 mmol/L (ref 101–111)
CO2: 26 mmol/L (ref 22–32)
CREATININE: 0.56 mg/dL — AB (ref 0.61–1.24)
Calcium: 9.7 mg/dL (ref 8.9–10.3)
GFR calc Af Amer: >60 mL/min (ref >60)
GFR calc non Af Amer: >60 mL/min (ref >60)
Glucose, Bld: 89 mg/dL (ref 65–99)
POTASSIUM: 4.3 mmol/L (ref 3.5–5.1)
SODIUM: 138 mmol/L (ref 135–145)

## 2015-11-18 ENCOUNTER — Ambulatory Visit (INDEPENDENT_AMBULATORY_CARE_PROVIDER_SITE_OTHER): Payer: BC Managed Care – PPO | Admitting: Family Medicine

## 2015-11-18 ENCOUNTER — Encounter: Payer: Self-pay | Admitting: Family Medicine

## 2015-11-18 VITALS — BP 138/88 | HR 85 | Temp 97.8°F | Ht 70.0 in | Wt 167.0 lb

## 2015-11-18 DIAGNOSIS — B9561 Methicillin susceptible Staphylococcus aureus infection as the cause of diseases classified elsewhere: Secondary | ICD-10-CM

## 2015-11-18 DIAGNOSIS — R7881 Bacteremia: Secondary | ICD-10-CM | POA: Diagnosis not present

## 2015-11-18 DIAGNOSIS — I10 Essential (primary) hypertension: Secondary | ICD-10-CM

## 2015-11-18 DIAGNOSIS — N139 Obstructive and reflux uropathy, unspecified: Secondary | ICD-10-CM | POA: Diagnosis not present

## 2015-11-18 NOTE — Progress Notes (Signed)
James Marshall is a 35 y.o. male who presents to [facility]: Primary Care Sports Medicine today for establish care.  Patient was noted to [facility] from July 8 to the 13th for a right obstructing ureteral stone causing pyelonephritis causing sepsis due to MSSA.   He's had a stent placed and was treated with IV antibiotics. He currently feels much better.  He currently has a PICC line and is receiving IV Ancef 3 times daily. He is an appointment to follow-up with infectious disease on August 7.  As for his ureteral stone he has an appointment tomorrow for stone extraction and possible stent extraction.  During his hospitalization his blood pressure was elevated and he was started on amlodipine.  Overall he feels well with no fevers chills nausea vomiting diarrhea or pain. He feels much better. He denies any medical problems for this reason hospitalization or any surgical history. He currently does not smoke and drinks minimally. 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   Past Medical History  Diagnosis Date  . Acute pyelonephritis   . Hypertension   . GERD (gastroesophageal reflux disease)   . Sepsis (HCC) 7/17  . Normocytic anemia 7/17   Past Surgical History  Procedure Laterality Date  . Cystoscopy w/ ureteral stent placement Right 11/07/2015    Procedure: CYSTOSCOPY WITH RETROGRADE AND URETERAL STENT PLACEMENT;  Surgeon: Bjorn Pippin, MD;  Location: [facility] OR;  Service: Urology;  Laterality: Right;   Social History  Substance Use Topics  . Smoking status: Former Smoker -- 1.00 packs/day for 10 years    Types: Cigarettes  . Smokeless tobacco: Former Neurosurgeon  . Alcohol Use: Yes     Comment: 2-3 times per week, no history of EtOH withdrawal   family history includes Cervical cancer in his mother; Diabetes in his paternal grandfather; Heart disease in his maternal grandfather;  Prostate cancer in his paternal grandfather.  ROS as above: No current headache, visual changes, nausea, vomiting, diarrhea, constipation, dizziness, abdominal pain, skin rash, fevers, chills, night sweats, weight loss, swollen lymph nodes, body aches, joint swelling, muscle aches, chest pain, shortness of breath, mood changes, visual or auditory hallucinations.    Medications: Current Outpatient Prescriptions  Medication Sig Dispense Refill  . amLODipine (NORVASC) 5 MG tablet Take 1 tablet (5 mg total) by mouth daily. 30 tablet 1  . ceFAZolin (ANCEF) 2-4 GM/100ML-% IVPB Inject 100 mLs (2 g total) into the vein every 8 (eight) hours. 8400 mL 0  . ibuprofen (ADVIL,MOTRIN) 400 MG tablet Take 1 tablet (400 mg total) by mouth every 8 (eight) hours as needed for fever, headache or moderate pain. 40 tablet 0  . phenazopyridine (PYRIDIUM) 200 MG tablet Take 1 tablet (200 mg total) by mouth 3 (three) times daily with meals as needed (urethral pain and burning.). (Patient not taking: Reported on 11/18/2015) 20 tablet 0   No current facility-administered medications for this visit.   Allergies  Allergen Reactions  . Hydrocodone Nausea And Vomiting     Exam:  BP 138/88 mmHg  Pulse 85  Temp(Src) 97.8 F (36.6 C) (Oral)  Ht  (1.778 m)  Wt 167 lb (75.751 kg)  BMI 23.96 kg/m2  SpO2 100% Gen: Well NAD HEENT: EOMI,  MMM Lungs: Normal work of breathing. CTABL Heart: RRR no MRG Abd: NABS, Soft. Nondistended, Nontender Exts: Brisk capillary refill, warm and well perfused.   Results for orders placed or performed during  the hospital encounter of 11/17/15 (from the past 24 hour(s))  Basic metabolic panel     Status: Abnormal   Collection Time: 11/17/15  2:30 PM  Result Value Ref Range   Sodium 138 135 - 145 mmol/L   Potassium 4.3 3.5 - 5.1 mmol/L   Chloride 103 101 - 111 mmol/L   CO2 26 22 - 32 mmol/L   Glucose, Bld 89 65 - 99 mg/dL   BUN 14 6 - 20 mg/dL   Creatinine, Ser 1.610.56 (L) 0.61  - 1.24 mg/dL   Calcium 9.7 8.9 - 09.610.3 mg/dL   GFR calc non Af Amer >60 >60 mL/min   GFR calc Af Amer >60 >60 mL/min   Anion gap 9 5 - 15  CBC     Status: Abnormal   Collection Time: 11/17/15  2:30 PM  Result Value Ref Range   WBC 9.3 4.0 - 10.5 K/uL   RBC 4.39 4.22 - 5.81 MIL/uL   Hemoglobin 13.7 13.0 - 17.0 g/dL   HCT 04.541.1 40.939.0 - 81.152.0 %   MCV 93.6 78.0 - 100.0 fL   MCH 31.2 26.0 - 34.0 pg   MCHC 33.3 30.0 - 36.0 g/dL   RDW 91.412.4 78.211.5 - 95.615.5 %   Platelets 658 (H) 150 - 400 K/uL   No results found.    Assessment and Plan: 35 y.o. male with   Hypertension: Well controlled. I suspect his blood pressure issues are more related to his recent illness that anything else. We'll recheck in a few months and probable discontinue the amlodipine if blood pressure continues to be very well controlled.  Kidney stone: Seems well managed per urology. We'll continue to follow and aide care anyway can.  History of bacteremia and sepsis due to MSSA.  Obviously very concerning and life-threatening. He seems to be doing excellently. Follow-up with Dr. Orvan Falconerampbell.  Return sooner if needed.     Discussed warning signs or symptoms. Please see discharge instructions. Patient expresses understanding.   CC: Cliffton AstersJohn Campbell MD Bjorn PippinJohn Wrenn, MD

## 2015-11-18 NOTE — Patient Instructions (Signed)
Thank you for coming in today. Return in about 3 months to recheck blood pressure.  Call if you are having problems.  Return sooner if needed.   Call or go to the emergency room if you get worse, have trouble breathing, have chest pains, or palpitations.  If your belly pain worsens, or you have high fever, bad vomiting, blood in your stool or Kawahara tarry stool go to the Emergency Room.

## 2015-11-19 ENCOUNTER — Ambulatory Visit (HOSPITAL_COMMUNITY): Payer: BC Managed Care – PPO | Admitting: Anesthesiology

## 2015-11-19 ENCOUNTER — Ambulatory Visit (HOSPITAL_COMMUNITY)
Admission: RE | Admit: 2015-11-19 | Discharge: 2015-11-19 | Disposition: A | Discharge status: Home / Self Care | Payer: BC Managed Care – PPO | Source: Ambulatory Visit | Attending: Urology | Admitting: Urology

## 2015-11-19 ENCOUNTER — Encounter (HOSPITAL_COMMUNITY)
Admission: RE | Disposition: A | Discharge status: Home / Self Care | Payer: Self-pay | Source: Ambulatory Visit | Attending: Urology

## 2015-11-19 ENCOUNTER — Encounter (HOSPITAL_COMMUNITY): Payer: Self-pay | Admitting: *Deleted

## 2015-11-19 DIAGNOSIS — Z87891 Personal history of nicotine dependence: Secondary | ICD-10-CM | POA: Insufficient documentation

## 2015-11-19 DIAGNOSIS — I10 Essential (primary) hypertension: Secondary | ICD-10-CM | POA: Insufficient documentation

## 2015-11-19 DIAGNOSIS — N132 Hydronephrosis with renal and ureteral calculous obstruction: Secondary | ICD-10-CM | POA: Diagnosis not present

## 2015-11-19 HISTORY — PX: CYSTOSCOPY/RETROGRADE/URETEROSCOPY/STONE EXTRACTION WITH BASKET: SHX5317

## 2015-11-19 SURGERY — CYSTOSCOPY, WITH CALCULUS REMOVAL USING BASKET
Anesthesia: General | Laterality: Right

## 2015-11-19 MED ORDER — MEPERIDINE HCL 50 MG/ML IJ SOLN: 6.2500 mg | INTRAMUSCULAR | Status: DC | PRN

## 2015-11-19 MED ORDER — CEFAZOLIN SODIUM-DEXTROSE 2-4 GM/100ML-% IV SOLN
INTRAVENOUS | Status: AC
  Filled 2015-11-19: qty 100

## 2015-11-19 MED ORDER — LACTATED RINGERS IV SOLN: INTRAVENOUS | Status: DC

## 2015-11-19 MED ORDER — PROPOFOL 10 MG/ML IV BOLUS
INTRAVENOUS | Status: AC
  Filled 2015-11-19: qty 20

## 2015-11-19 MED ORDER — PROPOFOL 10 MG/ML IV BOLUS
INTRAVENOUS | Status: DC | PRN
  Administered 2015-11-19: 30 mg via INTRAVENOUS
  Administered 2015-11-19: 170 mg via INTRAVENOUS

## 2015-11-19 MED ORDER — MIDAZOLAM HCL 5 MG/5ML IJ SOLN
INTRAMUSCULAR | Status: DC | PRN
  Administered 2015-11-19: 2 mg via INTRAVENOUS

## 2015-11-19 MED ORDER — DEXAMETHASONE SODIUM PHOSPHATE 10 MG/ML IJ SOLN
INTRAMUSCULAR | Status: DC | PRN
  Administered 2015-11-19: 10 mg via INTRAVENOUS

## 2015-11-19 MED ORDER — SODIUM CHLORIDE 0.9 % IR SOLN
Status: DC | PRN
  Administered 2015-11-19: 6000 mL via INTRAVESICAL

## 2015-11-19 MED ORDER — CEFAZOLIN SODIUM-DEXTROSE 2-4 GM/100ML-% IV SOLN
2.0000 g | INTRAVENOUS | Status: AC
  Administered 2015-11-19: 2 g via INTRAVENOUS

## 2015-11-19 MED ORDER — OXYCODONE HCL 5 MG PO TABS
5.0000 mg | ORAL_TABLET | ORAL | Status: DC | PRN
  Administered 2015-11-19: 5 mg via ORAL
  Filled 2015-11-19: qty 1

## 2015-11-19 MED ORDER — LIDOCAINE HCL (CARDIAC) 20 MG/ML IV SOLN
INTRAVENOUS | Status: AC
  Filled 2015-11-19: qty 5

## 2015-11-19 MED ORDER — HEPARIN SOD (PORK) LOCK FLUSH 100 UNIT/ML IV SOLN: 250.0000 [IU] | INTRAVENOUS | Status: DC | PRN

## 2015-11-19 MED ORDER — LACTATED RINGERS IV SOLN
INTRAVENOUS | Status: DC
Start: 2015-11-19 — End: 2015-11-19
  Administered 2015-11-19: 12:00:00 via INTRAVENOUS

## 2015-11-19 MED ORDER — ONDANSETRON HCL 4 MG/2ML IJ SOLN
INTRAMUSCULAR | Status: DC | PRN
  Administered 2015-11-19: 4 mg via INTRAVENOUS

## 2015-11-19 MED ORDER — FENTANYL CITRATE (PF) 100 MCG/2ML IJ SOLN
INTRAMUSCULAR | Status: AC
  Filled 2015-11-19: qty 2

## 2015-11-19 MED ORDER — LIDOCAINE HCL (CARDIAC) 20 MG/ML IV SOLN
INTRAVENOUS | Status: DC | PRN
  Administered 2015-11-19: 100 mg via INTRAVENOUS

## 2015-11-19 MED ORDER — MIDAZOLAM HCL 2 MG/2ML IJ SOLN
INTRAMUSCULAR | Status: AC
  Filled 2015-11-19: qty 2

## 2015-11-19 MED ORDER — FENTANYL CITRATE (PF) 100 MCG/2ML IJ SOLN: 25.0000 ug | INTRAMUSCULAR | Status: DC | PRN

## 2015-11-19 MED ORDER — FENTANYL CITRATE (PF) 100 MCG/2ML IJ SOLN
INTRAMUSCULAR | Status: DC | PRN
  Administered 2015-11-19 (×2): 50 ug via INTRAVENOUS

## 2015-11-19 MED ORDER — PROMETHAZINE HCL 25 MG/ML IJ SOLN: 6.2500 mg | INTRAMUSCULAR | Status: DC | PRN

## 2015-11-19 SURGICAL SUPPLY — 21 items
BAG URO CATCHER STRL LF (MISCELLANEOUS) ×4 IMPLANT
BASKET STONE NCOMPASS (UROLOGICAL SUPPLIES) IMPLANT
CATH URET DUAL LUMEN 6-10FR 50 (CATHETERS) ×4 IMPLANT
CLOTH BEACON ORANGE TIMEOUT ST (SAFETY) ×4 IMPLANT
EXTRACTOR STONE NITINOL NGAGE (UROLOGICAL SUPPLIES) ×4 IMPLANT
FIBER LASER FLEXIVA 1000 (UROLOGICAL SUPPLIES) IMPLANT
FIBER LASER FLEXIVA 365 (UROLOGICAL SUPPLIES) IMPLANT
FIBER LASER FLEXIVA 550 (UROLOGICAL SUPPLIES) IMPLANT
FIBER LASER TRAC TIP (UROLOGICAL SUPPLIES) IMPLANT
GLOVE SURG SS PI 8.0 STRL IVOR (GLOVE) ×4 IMPLANT
GOWN STRL REUS W/TWL XL LVL3 (GOWN DISPOSABLE) ×4 IMPLANT
GUIDEWIRE STR DUAL SENSOR (WIRE) ×4 IMPLANT
IV NS 1000ML (IV SOLUTION) ×2
IV NS 1000ML BAXH (IV SOLUTION) ×2 IMPLANT
IV NS IRRIG 3000ML ARTHROMATIC (IV SOLUTION) ×4 IMPLANT
MANIFOLD NEPTUNE II (INSTRUMENTS) ×4 IMPLANT
PACK CYSTO (CUSTOM PROCEDURE TRAY) ×4 IMPLANT
SHEATH ACCESS URETERAL 38CM (SHEATH) ×4 IMPLANT
SHEATH URET ACCESS 10/12FR (MISCELLANEOUS) IMPLANT
TUBING CONNECTING 10 (TUBING) ×3 IMPLANT
TUBING CONNECTING 10' (TUBING) ×1

## 2015-11-19 NOTE — Transfer of Care (Signed)
Immediate Anesthesia Transfer of Care Note  Patient: James Marshall  Procedure(s) Performed: Procedure(s): CYSTOSCOPY WITH RIGHT URETEROSCOPY, STONE BASKETRY (Right)  Patient Location: PACU  Anesthesia Type:General  Level of Consciousness: sedated  Airway & Oxygen Therapy: Patient Spontanous Breathing and Patient connected to face mask oxygen  Post-op Assessment: Report given to RN and Post -op Vital signs reviewed and stable  Post vital signs: Reviewed and stable  Last Vitals:  Filed Vitals:   11/19/15 1050  BP: 132/88  Pulse: 81  Temp: 37.3 C  Resp: 19    Last Pain: There were no vitals filed for this visit.    Patients Stated Pain Goal: 3 (11/19/15 1104)  Complications: No apparent anesthesia complications

## 2015-11-19 NOTE — Anesthesia Preprocedure Evaluation (Addendum)
Anesthesia Evaluation  Patient identified by MRN, date of birth, ID band Patient awake    Reviewed: Allergy & Precautions, NPO status , Patient's Chart, lab work & pertinent test results  Airway Mallampati: II  TM Distance: >3 FB Neck ROM: Full    Dental  (+) Teeth Intact, Dental Advisory Given   Pulmonary former smoker,    breath sounds clear to auscultation       Cardiovascular hypertension, Pt. on medications  Rhythm:Regular Rate:Normal     Neuro/Psych negative neurological ROS  negative psych ROS   GI/Hepatic Neg liver ROS, GERD  ,  Endo/Other  negative endocrine ROS  Renal/GU   negative genitourinary   Musculoskeletal negative musculoskeletal ROS (+)   Abdominal   Peds negative pediatric ROS (+)  Hematology negative hematology ROS (+)   Anesthesia Other Findings   Reproductive/Obstetrics negative OB ROS                            Lab Results  Component Value Date   WBC 9.3 11/17/2015   HGB 13.7 11/17/2015   HCT 41.1 11/17/2015   MCV 93.6 11/17/2015   PLT 658* 11/17/2015   Lab Results  Component Value Date   CREATININE 0.56* 11/17/2015   BUN 14 11/17/2015   NA 138 11/17/2015   K 4.3 11/17/2015   CL 103 11/17/2015   CO2 26 11/17/2015   Lab Results  Component Value Date   INR 1.18 11/07/2015   Echo: - Left ventricle: The cavity size was normal. Wall thickness was normal. Systolic function was normal. The estimated ejection fraction was in the range of 60% to 65%. Wall motion was normal; there were no regional wall motion abnormalities. Left ventricular diastolic function parameters were normal. - Mitral valve: There was mild regurgitation directed posteriorly. - Impressions: There was no evidence of a vegetation.    Anesthesia Physical Anesthesia Plan  ASA: II  Anesthesia Plan: General   Post-op Pain Management:    Induction: Intravenous  Airway  Management Planned: LMA  Additional Equipment:   Intra-op Plan:   Post-operative Plan: Extubation in OR  Informed Consent: I have reviewed the patients History and Physical, chart, labs and discussed the procedure including the risks, benefits and alternatives for the proposed anesthesia with the patient or authorized representative who has indicated his/her understanding and acceptance.   Dental advisory given  Plan Discussed with: CRNA  Anesthesia Plan Comments:         Anesthesia Quick Evaluation

## 2015-11-19 NOTE — Brief Op Note (Signed)
11/19/2015  1:32 PM  PATIENT:  James Marshall  35 y.o. male  PRE-OPERATIVE DIAGNOSIS:  RIGHT DISTAL STONE  POST-OPERATIVE DIAGNOSIS:  RIGHT DISTAL STONE  PROCEDURE:  Procedure(s): CYSTOSCOPY WITH RIGHT URETEROSCOPY, STONE BASKETRY (Right)  SURGEON:  Surgeon(s) and Role:    * Bjorn PippinJohn Avyonna Wagoner, MD - Primary  PHYSICIAN ASSISTANT:   ASSISTANTS: none   ANESTHESIA:   general  EBL:  Total I/O In: 400 [I.V.:400] Out: -   BLOOD ADMINISTERED:none  DRAINS: none   LOCAL MEDICATIONS USED:  NONE  SPECIMEN:  Source of Specimen:  ureteral stone  DISPOSITION OF SPECIMEN:  [id number]  COUNTS:  YES  TOURNIQUET:  * No tourniquets in log *  DICTATION: .Other Dictation: Dictation Number [id number]  PLAN OF CARE: Discharge to home after PACU  PATIENT DISPOSITION:  PACU - hemodynamically stable.   Delay start of Pharmacological VTE agent (>24hrs) due to surgical blood loss or risk of bleeding: not applicable

## 2015-11-19 NOTE — Discharge Instructions (Signed)
CYSTOSCOPY HOME CARE INSTRUCTIONS  Activity: Rest for the remainder of the day.  Do not drive or operate equipment today.  You may resume normal activities in one to two days as instructed by your physician.   Meals: Drink plenty of liquids and eat light foods such as gelatin or soup this evening.  You may return to a normal meal plan tomorrow.  Return to Work: You may return to work in one to two days or as instructed by your physician.  Special Instructions / Symptoms: Call your physician if any of these symptoms occur:   -persistent or heavy bleeding  -bleeding which continues after first few urination  -large blood clots that are difficult to pass  -urine stream diminishes or stops completely  -fever equal to or higher than 101 degrees Farenheit.  -cloudy urine with a strong, foul odor  -severe pain  Females should always wipe from front to back after elimination.  You may feel some burning pain when you urinate.  This should disappear with time.  Applying moist heat to the lower abdomen or a hot tub bath may help relieve the pain. \  Bring stone to office at f/u appt.   Patient Signature:  ________________________________________________________  Nurse's Signature:  ________________________________________________________

## 2015-11-19 NOTE — H&P (Signed)
Subjective: CC: Right flank pain.  Hx: James Marshall is a 35 yo male who I asked to see in consultation by Dr. Criss Alvine for a 3.51mm right distal stone with sepsis. He had the onset yesterday at 6pm of right flank pain and dark urine. The pain was severe with nausea and vomiting. He has an elevated lactate in the ER today. His WBC is 11.5K and his urine is dirty. He has had a prior kidney infection and it looks like it was in 2005. He has no other GU history.  ROS:  Review of Systems  Constitutional: Positive for fever and chills.  Gastrointestinal: Positive for nausea, vomiting and abdominal pain.  Genitourinary: Positive for dysuria and flank pain.  All other systems reviewed and are negative.   No Known Allergies  History reviewed. No pertinent past medical history.  History reviewed. No pertinent past surgical history.  Social History   Social History  . Marital Status: Single    Spouse Name: N/A  . Number of Children: N/A  . Years of Education: N/A   Occupational History  . Not on file.   Social History Main Topics  . Smoking status: Never Smoker   . Smokeless tobacco: Not on file  . Alcohol Use: Yes  . Drug Use: Not on file  . Sexual Activity: Not on file   Other Topics Concern  . Not on file   Social History Narrative  . No narrative on file    No family history on file.  Anti-infectives: Anti-infectives    Start   Dose/Rate Route Frequency Ordered Stop   11/08/15 0100  piperacillin-tazobactam (ZOSYN) IVPB 3.375 g    3.375 g 12.5 mL/hr over 240 Minutes Intravenous Every 8 hours 11/07/15 1657    11/07/15 1700  piperacillin-tazobactam (ZOSYN) IVPB 3.375 g    3.375 g 100 mL/hr over 30 Minutes Intravenous Once 11/07/15 1646 11/07/15 1757      Current Facility-Administered Medications  Medication Dose Route Frequency Provider Last Rate Last Dose  .  [START ON 11/08/2015] piperacillin-tazobactam (ZOSYN) IVPB 3.375 g 3.375 g Intravenous Q8H Emi Holes, [facility]     No current outpatient prescriptions on file.     Objective: Vital signs in last 24 hours: Temp: [101 F (38.3 C)-103 F (39.4 C)] 102.6 F (39.2 C) (07/08 1921) Pulse Rate: [97-114] 103 (07/08 1845) Resp: [14-22] 19 (07/08 1845) BP: (112-132)/(68-82) 126/76 mmHg (07/08 1845) SpO2: [92 %-99 %] 95 % (07/08 1845) Weight: [79.379 kg (175 lb)] 79.379 kg (175 lb) (07/08 1308)  Intake/Output from previous day:   Intake/Output this shift: Total I/O In: 1500 [I.V.:1500] Out: -    Physical Exam  Constitutional: He is oriented to person, place, and time and well-developed, well-nourished, and in no distress.  Having a rigor  HENT:  Head: Normocephalic and atraumatic.  Neck: Normal range of motion. Neck supple. No thyromegaly present.  Cardiovascular:  Sinus tach with no murmur.  Pulmonary/Chest: Effort normal and breath sounds normal. No respiratory distress.  Abdominal: Soft. He exhibits no mass. There is tenderness (RUQ and RCVA). There is guarding.  Musculoskeletal: Normal range of motion. He exhibits no edema or tenderness.  Lymphadenopathy:   He has no cervical adenopathy.  Neurological: He is alert and oriented to person, place, and time.  Skin:  Hot and flushed.  Psychiatric: Mood and affect normal.  Vitals reviewed.   Lab Results:   Recent Labs (last 2 labs)      Recent Labs  11/07/15 1315  WBC  11.5*  HGB 14.8  HCT 41.8  PLT 307     BMET  Recent Labs (last 2 labs)      Recent Labs  11/07/15 1315  NA 139  K 3.5  CL 106  CO2 23  GLUCOSE 141*  BUN 13  CREATININE 0.79  CALCIUM 9.5     PT/INR  Recent Labs (last 2 labs)     No results for input(s): LABPROT, INR in the last 72 hours.   ABG  Recent Labs (last 2 labs)     No results for input(s): PHART, HCO3 in the last 72  hours.  Invalid input(s): PCO2, PO2    Studies/Results:  Imaging Results (Last 48 hours)    Ct Abdomen Pelvis W Contrast  11/07/2015 CLINICAL DATA: Right lower quadrant pain for 2 days. Vomiting and diarrhea. EXAM: CT ABDOMEN AND PELVIS WITH CONTRAST TECHNIQUE: Multidetector CT imaging of the abdomen and pelvis was performed using the standard protocol following bolus administration of intravenous contrast. CONTRAST: 1 ISOVUE-300 IOPAMIDOL (ISOVUE-300) INJECTION 61% COMPARISON: Multiple CT scans since Sep 01, 2003 FINDINGS: A few thin walled lung cysts are seen in the right lung base with no solid components. No suspicious nodules or masses. The lung bases are otherwise normal. No free air or free fluid. Bilateral renal stones are identified with the largest seen on the left measuring 6.7 mm on series 5, image 91. There is very mild stranding adjacent to the right kidney in addition to moderate hydronephrosis, not seen previously. The right ureter is prominent in caliber along its length with a 3.5 mm stone in the distal right ureter just above the UVJ. There is mild thickening of the ureteral wall at the level of the right distal ureteral stone, consistent with inflammation. Additionally, there is an ill-defined region of low attenuation in the anterior right kidney as seen on series 2, image 31 with no definitive focal adjacent fat stranding. This low-attenuation measures 2.6 by 1.4 cm. A few tiny low-attenuation lesions in both kidneys are likely cysts but too small to characterize. No other suspicious renal abnormalities. There is hepatic steatosis. The liver, gallbladder, portal vein, spleen, adrenal glands, and pancreas are within normal limits. The abdominal aorta is normal in appearance. No adenopathy. The stomach and small bowel are normal. The colon and visualized appendix are normal. The pelvis demonstrates a distal right ureteral stone as described above. No adenopathy or mass identified the  pelvis. The prostate, seminal vesicles, and bladder are normal in appearance. The visualized bones are unremarkable. Delayed images demonstrate no filling defects in the upper renal collecting systems bilaterally. There is appropriate excretion into the right renal collecting system. IMPRESSION: 1. Obstructing stone in the distal right ureter measuring 3.5 mm with moderate hydronephrosis and mild perinephric stranding. The region of the ill defined low-attenuation in the anterior right kidney could represent developing focal pyelonephritis. No discrete abscess seen on this study. Recommend clinical correlation and follow-up. 2. Multiple other bilateral renal stones. Electronically Signed By: Gerome Sam III M.D On: 11/07/2015 18:38    I have discussed the case with Dr. Criss Alvine and reviewed the CT films and report and labs.   Assessment: Obstructing right distal stone with sepsis of a urinary origin.  He was given Zosyn prior to cultures.  I will take him to the OR for cystoscopy and right ureteral stenting and reviewed the risks in detail as noted in the consent attestation. He is aware he will need a second procedure for stone removal.  He will be admitted to medicine.   Addendum:  He had the right stent placed and returns today for stone removal.  He is on ancef for staph sepsis.

## 2015-11-19 NOTE — Op Note (Signed)
[name], James               ACCOUNT NO.:  [id number]  MEDICAL RECORD NO.:  [id number]  LOCATION:  [facility]                         [facility]  PHYSICIAN:  Excell SeltzerJohn J. Annabell HowellsWrenn, M.D.    DATE OF BIRTH:  09-21-1980  DATE OF PROCEDURE:  11/19/2015 DATE OF DISCHARGE:  11/19/2015                              OPERATIVE REPORT   PATIENT OF:  Excell SeltzerJohn J. Annabell HowellsWrenn, M.D.  PROCEDURE: 1. Cystoscopy with removal of right double-J stent. 2. Right ureteroscopic stone extraction.  PREOPERATIVE DIAGNOSIS:  Right distal ureteral stone.  POSTOPERATIVE DIAGNOSIS:  Right distal ureteral stone.  SURGEON:  Excell SeltzerJohn J. Annabell HowellsWrenn, M.D.  ANESTHESIA:  General.  SPECIMEN:  Stone.  DRAINS:  None.  BLOOD LOSS:  None.  COMPLICATIONS:  None.  INDICATIONS:  James Marshall is a 35 year old male who originally presented septic with a 3.5 mm right distal stone.  He underwent urgent stenting and returns today after antibiotic therapy for stone extraction  FINDINGS AND PROCEDURE:  The patient was taken to the operating room where general anesthetic was induced.  He was given 2 g of Ancef.  He was placed in a lithotomy position and was fitted with PAS hose.  His perineum and genitalia were prepped with Betadine solution.  He was draped in usual sterile fashion.  Cystoscopy was performed using a 23-French scope and 30-degree lens. Examination revealed a normal urethra.  The external sphincter was short.  The prostate was short without obstruction.  Examination of the bladder revealed a smooth wall.  The urine was Pyridium stained.  The left ureteral orifice was unremarkable.  The right ureteral orifice had mild edema with a stent in place.  The stent was grasped and pulled from the urethral meatus where a guidewire was passed to the kidney, and the stent was removed.  A 6.5-French semirigid ureteroscope was passed alongside the wire.  The stone was visualized in the distal ureter.  It was grasped with an NGage basket and  removed intact.  At this point, the cystoscope sheath was placed back over the wire.  The bladder was drained, and the wire was removed.  It was not felt that an additional stent was required.  The patient was taken down from a lithotomy position.  His anesthetic was reversed.  He was moved to Recovery in stable condition.  There were no complications.     Excell SeltzerJohn J. Annabell HowellsWrenn, M.D.     JJW/MEDQ  D:  11/19/2015  T:  11/19/2015  Job:  [id number]

## 2015-11-19 NOTE — Anesthesia Postprocedure Evaluation (Signed)
Anesthesia Post Note  Patient: James Marshall  Procedure(s) Performed: Procedure(s) (LRB): CYSTOSCOPY WITH RIGHT URETEROSCOPY, STONE BASKETRY (Right)  Patient location during evaluation: PACU Anesthesia Type: General Level of consciousness: awake and alert Pain management: pain level controlled Vital Signs Assessment: post-procedure vital signs reviewed and stable Respiratory status: spontaneous breathing, nonlabored ventilation, respiratory function stable and patient connected to nasal cannula oxygen Cardiovascular status: blood pressure returned to baseline and stable Postop Assessment: no signs of nausea or vomiting Anesthetic complications: no    Last Vitals:  Filed Vitals:   11/19/15 1430 11/19/15 1450  BP: 133/85 111/83  Pulse: 64 62  Temp:  36.7 C  Resp: 13 12    Last Pain:  Filed Vitals:   11/19/15 1452  PainSc: 0-No pain                 Shelton SilvasKevin D Hollis

## 2015-11-20 ENCOUNTER — Encounter (HOSPITAL_COMMUNITY): Payer: Self-pay | Admitting: Urology

## 2015-11-26 ENCOUNTER — Encounter: Payer: Self-pay | Admitting: Internal Medicine

## 2015-11-26 ENCOUNTER — Telehealth: Payer: Self-pay | Admitting: Pharmacist Clinician (PhC)/ Clinical Pharmacy Specialist

## 2015-11-26 NOTE — Telephone Encounter (Signed)
[facility] called to state that the pt received his last of the plan 2 wks of therapy for his MSSA pyelo/stone. He received ureeroscopic procedure on 7/20. Dr. Blair Dolphin plan was to cont abx through the procedure. [facility] asked if they could pull the PICC today. Pt has a f/u appt with Dr. Orvan Falconer on 8/7. D/w case with Dr. Daiva Eves, will call [facility] to pull PICC.   James Marshall, PharmD Pager: [phone] 11/26/2015 4:43 PM

## 2015-12-07 ENCOUNTER — Ambulatory Visit (INDEPENDENT_AMBULATORY_CARE_PROVIDER_SITE_OTHER): Payer: BC Managed Care – PPO | Admitting: Internal Medicine

## 2015-12-07 ENCOUNTER — Encounter: Payer: Self-pay | Admitting: Internal Medicine

## 2015-12-07 DIAGNOSIS — B9561 Methicillin susceptible Staphylococcus aureus infection as the cause of diseases classified elsewhere: Secondary | ICD-10-CM | POA: Diagnosis not present

## 2015-12-07 DIAGNOSIS — R7881 Bacteremia: Secondary | ICD-10-CM | POA: Diagnosis not present

## 2015-12-07 NOTE — Assessment & Plan Note (Signed)
I'm hopeful that has staph aureus bacteremia and pyelonephritis of now been cured. I asked him to call me if he has any signs or symptoms suggesting recurrence.

## 2015-12-07 NOTE — Progress Notes (Signed)
[facility]  Patient Active Problem List   Diagnosis Date Noted  . Staphylococcus aureus bacteremia 11/08/2015    Priority: High  . Sepsis (HCC) 11/07/2015    Priority: High  . Acute pyelonephritis 11/07/2015    Priority: High  . Right ureteral stone 11/07/2015    Priority: Medium  . Obstructive uropathy 11/07/2015    Priority: Medium  . Hypokalemia   . Essential hypertension   . Normocytic anemia 11/08/2015    Patient's Medications  New Prescriptions   No medications on file  Previous Medications   AMLODIPINE (NORVASC) 5 MG TABLET    Take 1 tablet (5 mg total) by mouth daily.   IBUPROFEN (ADVIL,MOTRIN) 400 MG TABLET    Take 1 tablet (400 mg total) by mouth every 8 (eight) hours as needed for fever, headache or moderate pain.   PHENAZOPYRIDINE (PYRIDIUM) 200 MG TABLET    Take 1 tablet (200 mg total) by mouth 3 (three) times daily with meals as needed (urethral pain and burning.).  Modified Medications   No medications on file  Discontinued Medications   No medications on file    Subjective: James Marshall is in for his hospital follow-up visit. He was hospitalized last month with MSSA bacteremia complicating right pyelonephritis related to an obstructing right ureteral stone. He had a ureteral stent placed. Follow-up blood cultures were negative within 48 hours. There is no evidence of endocarditis clinically or by transthoracic echocardiogram. I elected not to do a TEE. He underwent stone extraction on 11/19/2015 and completed a total of 19 days of IV cefazolin on 11/26/2015. He had no problems tolerating cefazolin or his PICC. He is feeling much better. He is not having any fever, chills or sweats. He is not having any abdominal pain or dysuria.  Review of Systems: Review of Systems  Constitutional: Negative for chills, diaphoresis, fever, malaise/fatigue and weight loss.  HENT: Negative for sore throat.   Respiratory: Negative for cough,  sputum production and shortness of breath.   Cardiovascular: Negative for chest pain.  Gastrointestinal: Negative for abdominal pain, diarrhea, nausea and vomiting.  Genitourinary: Negative for dysuria and frequency.  Musculoskeletal: Negative for back pain.  Skin: Negative for rash.  Neurological: Negative for headaches.    Past Medical History:  Diagnosis Date  . Acute pyelonephritis   . GERD (gastroesophageal reflux disease)   . Hypertension   . Normocytic anemia 7/17  . Sepsis (HCC) 7/17    Social History  Substance Use Topics  . Smoking status: Former Smoker    Packs/day: 1.00    Years: 10.00    Types: Cigarettes  . Smokeless tobacco: Former NeurosurgeonUser  . Alcohol use Yes     Comment: 2-3 times per week, no history of EtOH withdrawal    Family History  Problem Relation Age of Onset  . Cervical cancer Mother   . Diabetes Paternal Grandfather   . Prostate cancer Paternal Grandfather   . Heart disease Maternal Grandfather     Allergies  Allergen Reactions  . Hydrocodone Nausea And Vomiting    Objective: Vitals:   12/07/15 1407  BP: (!) 156/104  Pulse: 86  Temp: 98.4 F (36.9 C)  TempSrc: Oral  Weight: 170 lb (77.1 kg)   Body mass index is 24.39 kg/m.  Physical Exam  Constitutional: He is oriented to person, place, and time.  He is in good spirits.  Cardiovascular: Normal rate and regular rhythm.   No murmur  heard. Pulmonary/Chest: Effort normal and breath sounds normal.  Abdominal: Soft. There is no tenderness.  No CVA tenderness.  Neurological: He is alert and oriented to person, place, and time.  Skin: No rash noted.  Psychiatric: Mood and affect normal.    Lab Results    Problem List Items Addressed This Visit      High   Staphylococcus aureus bacteremia    I'm hopeful that has staph aureus bacteremia and pyelonephritis of now been cured. I asked him to call me if he has any signs or symptoms suggesting recurrence.       Other Visit  Diagnoses   None.      Cliffton Asters, MD [facility] [phone] pager   [phone] cell 12/07/2015, 2:35 PM

## 2015-12-15 ENCOUNTER — Encounter: Payer: Self-pay | Admitting: Internal Medicine

## 2016-01-05 ENCOUNTER — Telehealth: Payer: Self-pay | Admitting: *Deleted

## 2016-01-05 NOTE — Telephone Encounter (Signed)
Patient called to advise that the symptoms that landed him in the ED are back. He advised at the last visit he was told to contact the office if his symptoms ever came back. He advised that for about a week he has had a constant pain in his left lower abdomen similar to but not as painful as last time. He noted that last time he let it go on for longer. He denies fever, chills or night sweats. Said he did not have those last time either just pain until he woke up in the ICU. Advised will have to give his provider a call to find out what he would like to do as we do not have any available appts this week.advised will call him back as soon as possible.

## 2016-01-06 ENCOUNTER — Other Ambulatory Visit: Payer: BC Managed Care – PPO

## 2016-01-06 ENCOUNTER — Other Ambulatory Visit: Payer: Self-pay | Admitting: *Deleted

## 2016-01-06 DIAGNOSIS — R7881 Bacteremia: Secondary | ICD-10-CM

## 2016-01-06 DIAGNOSIS — B9561 Methicillin susceptible Staphylococcus aureus infection as the cause of diseases classified elsewhere: Secondary | ICD-10-CM

## 2016-01-06 LAB — CBC WITH DIFFERENTIAL/PLATELET
Basophils Absolute: 0 cells/uL (ref 0–200)
Basophils Relative: 0 %
Eosinophils Absolute: 177 cells/uL (ref 15–500)
Eosinophils Relative: 3 %
HCT: 45 % (ref 38.5–50.0)
Hemoglobin: 15.6 g/dL (ref 13.2–17.1)
Lymphocytes Relative: 29 %
Lymphs Abs: 1711 cells/uL (ref 850–3900)
MCH: 31.5 pg (ref 27.0–33.0)
MCHC: 34.7 g/dL (ref 32.0–36.0)
MCV: 90.7 fL (ref 80.0–100.0)
MPV: 9.6 fL (ref 7.5–12.5)
Monocytes Absolute: 531 cells/uL (ref 200–950)
Monocytes Relative: 9 %
Neutro Abs: 3481 cells/uL (ref 1500–7800)
Neutrophils Relative %: 59 %
Platelets: 300 10*3/uL (ref 140–400)
RBC: 4.96 MIL/uL (ref 4.20–5.80)
RDW: 14 % (ref 11.0–15.0)
WBC: 5.9 10*3/uL (ref 3.8–10.8)

## 2016-01-06 LAB — BASIC METABOLIC PANEL
BUN: 13 mg/dL (ref 7–25)
CO2: 25 mmol/L (ref 20–31)
Calcium: 10.2 mg/dL (ref 8.6–10.3)
Chloride: 97 mmol/L — ABNORMAL LOW (ref 98–110)
Creat: 0.79 mg/dL (ref 0.60–1.35)
Glucose, Bld: 80 mg/dL (ref 65–99)
Potassium: 4.6 mmol/L (ref 3.5–5.3)
Sodium: 133 mmol/L — ABNORMAL LOW (ref 135–146)

## 2016-01-06 NOTE — Telephone Encounter (Signed)
Please have him come in today for CBC, BMP, UA, urine culture and blood cultures 2.

## 2016-01-06 NOTE — Telephone Encounter (Signed)
Per Dr Orvan Falconerampbell called the patient to advise him to come for labs today but had to leave a message as he did not answer the line. Will make appointment once he responds. Labs have been added.

## 2016-01-13 LAB — CULTURE, BLOOD (SINGLE)
ORGANISM ID, BACTERIA: NO GROWTH
Organism ID, Bacteria: NO GROWTH

## 2016-01-15 ENCOUNTER — Telehealth: Payer: Self-pay | Admitting: Internal Medicine

## 2016-01-15 NOTE — Telephone Encounter (Signed)
James HillDonald recently developed some new left-sided abdominal pain. It is mild but persistent. The pain that he had when he was in the hospital was on the right side and related to pyelonephritis caused by obstructing right ureteral stones. He has not had any fever, chills, sweats, dysuria, hematuria, nausea, vomiting or diarrhea. Blood cultures done on 01/06/2016 are negative. I told him that I did not know what was causing the pain but I have no reason to believe that he has staph aureus infection remains active. It appears that he has been cured. He was reassured.

## 2016-05-24 ENCOUNTER — Encounter: Payer: Self-pay | Admitting: Family Medicine

## 2016-05-24 ENCOUNTER — Ambulatory Visit (INDEPENDENT_AMBULATORY_CARE_PROVIDER_SITE_OTHER): Payer: BC Managed Care – PPO | Admitting: Family Medicine

## 2016-05-24 VITALS — BP 157/95 | HR 76 | Temp 98.5°F | Wt 170.0 lb

## 2016-05-24 DIAGNOSIS — R109 Unspecified abdominal pain: Secondary | ICD-10-CM | POA: Insufficient documentation

## 2016-05-24 LAB — POCT URINALYSIS DIPSTICK
Bilirubin, UA: NEGATIVE
Blood, UA: NEGATIVE
GLUCOSE UA: NEGATIVE
Ketones, UA: 40
NITRITE UA: NEGATIVE
Protein, UA: NEGATIVE
Spec Grav, UA: <=1.005
Urobilinogen, UA: 0.2
pH, UA: 6

## 2016-05-24 MED ORDER — NAPROXEN 500 MG PO TABS: 500.0000 mg | ORAL_TABLET | Freq: Two times a day (BID) | ORAL | 0 refills | Status: DC

## 2016-05-24 MED ORDER — TAMSULOSIN HCL 0.4 MG PO CAPS: 0.4000 mg | ORAL_CAPSULE | Freq: Every day | ORAL | 3 refills | Status: DC

## 2016-05-24 MED ORDER — CEFDINIR 300 MG PO CAPS: 300.0000 mg | ORAL_CAPSULE | Freq: Two times a day (BID) | ORAL | 0 refills | Status: DC

## 2016-05-24 NOTE — Progress Notes (Signed)
James Marshall is a 36 y.o. male who [facility] today for flank pain. Patient notes a 1.5 week history of mild left abdominal pain worsening into left-sided flank pain over the last day or 2. This is associated with sweats chills and subjective fevers. He denies any vomiting or diarrhea. He notes some burning with urination. He has a pertinent past medical history significant for staph pyelonephritis resulting in staph sepsis in July 2017. He was doing well until recently. He is concerned he may be developing pyelonephritis again. Complementing his pyelonephritis was kidney stones which were subsequently removed.   Past Medical History:  Diagnosis Date  . Acute pyelonephritis   . GERD (gastroesophageal reflux disease)   . Hypertension   . Normocytic anemia 7/17  . Sepsis (HCC) 7/17   Past Surgical History:  Procedure Laterality Date  . CYSTOSCOPY W/ URETERAL STENT PLACEMENT Right 11/07/2015   Procedure: CYSTOSCOPY WITH RETROGRADE AND URETERAL STENT PLACEMENT;  Surgeon: Bjorn PippinJohn Wrenn, MD;  [facility];  Service: Urology;  Laterality: Right;  . CYSTOSCOPY/RETROGRADE/URETEROSCOPY/STONE EXTRACTION WITH BASKET Right 11/19/2015   Procedure: CYSTOSCOPY WITH RIGHT URETEROSCOPY, STONE BASKETRY;  Surgeon: Bjorn PippinJohn Wrenn, MD;  Location: [facility];  Service: Urology;  Laterality: Right;   Social History  Substance Use Topics  . Smoking status: Former Smoker    Packs/day: 1.00    Years: 10.00    Types: Cigarettes  . Smokeless tobacco: Former NeurosurgeonUser  . Alcohol use Yes     Comment: 2-3 times per week, no history of EtOH withdrawal   family history includes Cervical cancer in his mother; Diabetes in his paternal grandfather; Heart disease in his maternal grandfather; Prostate cancer in his paternal grandfather.  ROS as above:  Medications: Current Outpatient Prescriptions    Medication Sig Dispense Refill  . amLODipine (NORVASC) 5 MG tablet Take 1 tablet (5 mg total) by mouth daily. (Patient not taking: Reported on 12/07/2015) 30 tablet 1  . cefdinir (OMNICEF) 300 MG capsule Take 1 capsule (300 mg total) by mouth 2 (two) times daily. 14 capsule 0  . ibuprofen (ADVIL,MOTRIN) 400 MG tablet Take 1 tablet (400 mg total) by mouth every 8 (eight) hours as needed for fever, headache or moderate pain. (Patient not taking: Reported on 12/07/2015) 40 tablet 0  . naproxen (NAPROSYN) 500 MG tablet Take 1 tablet (500 mg total) by mouth 2 (two) times daily with a meal. 30 tablet 0  . tamsulosin (FLOMAX) 0.4 MG CAPS capsule Take 1 capsule (0.4 mg total) by mouth daily. 30 capsule 3   No current facility-administered medications for this visit.    Allergies  Allergen Reactions  . Hydrocodone Nausea And Vomiting    Health Maintenance Health Maintenance  Topic Date Due  . HIV Screening  08/05/1994  . TETANUS/TDAP  08/05/1998  . INFLUENZA VACCINE  12/01/2015     Exam:  BP (!) 157/95   Pulse 76   Temp 98.5 F (36.9 C) (Oral)   Wt 170 lb (77.1 kg)   BMI 24.39 kg/m  Gen: Well NAD Nontoxic appearing HEENT: EOMI,  MMM Lungs: Normal work of breathing. CTABL Heart: RRR no MRG Abd: NABS, Soft. Nondistended, Nontender no masses palpated. Mildly tender palpation left CV angle percussion Exts: Brisk capillary refill, warm and well perfused.    Results for orders placed or performed in visit on 05/24/16 (from the past 72 hour(s))  POCT Urinalysis Dipstick  Status: Abnormal   Collection Time: 05/24/16  2:25 PM  Result Value Ref Range   Color, UA yellow    Clarity, UA clear    Glucose, UA negative    Bilirubin, UA negative    Ketones, UA 40 mg/dl    Spec Grav, UA <=9.604    Blood, UA negative    pH, UA 6.0    Protein, UA negative    Urobilinogen, UA 0.2    Nitrite, UA negative    Leukocytes, UA Trace (A) Negative   No results found.    Assessment and  Plan: 36 y.o. male with Left flank pain concerning for early pyelonephritis. Urine culture CBC and metabolic panel pending. Empiric treatment with naproxen Omnicef and Flomax. Additionally recommend patient follow-up with his urologist. Recheck in 3 days if worse or not better will consider noncontrast CT scan of the abdomen and pelvis.   Orders Placed This Encounter  Procedures  . Urine Culture  . CBC with Differential/Platelet  . COMPLETE METABOLIC PANEL WITH GFR  . POCT Urinalysis Dipstick    Discussed warning signs or symptoms. Please see discharge instructions. Patient expresses understanding.

## 2016-05-24 NOTE — Patient Instructions (Signed)
Thank you for coming in today. If your belly pain worsens, or you have high fever, bad vomiting, blood in your stool or Aguino tarry stool go to the Emergency Room.  Recheck Friday unless 100% better.  I recommend that you follow up with your Urologist at [facility] in the near future.   Take Omnicefand Naproxen And flomax for pain.    Flank Pain Flank pain refers to pain that is located on the side of the body between the upper abdomen and the back. The pain may occur over a short period of time (acute) or may be long-term or reoccurring (chronic). It may be mild or severe. Flank pain can be caused by many things. CAUSES  Some of the more common causes of flank pain include:  Muscle strains.   Muscle spasms.   A disease of your spine (vertebral disk disease).   A lung infection (pneumonia).   Fluid around your lungs (pulmonary edema).   A kidney infection.   Kidney stones.   A very painful skin rash caused by the chickenpox virus (shingles).   Gallbladder disease.  HOME CARE INSTRUCTIONS  Home care will depend on the cause of your pain. In general,  Rest as directed by your caregiver.  Drink enough fluids to keep your urine clear or pale yellow.  Only take over-the-counter or prescription medicines as directed by your caregiver. Some medicines may help relieve the pain.  Tell your caregiver about any changes in your pain.  Follow up with your caregiver as directed. SEEK IMMEDIATE MEDICAL CARE IF:   Your pain is not controlled with medicine.   You have new or worsening symptoms.  Your pain increases.   You have abdominal pain.   You have shortness of breath.   You have persistent nausea or vomiting.   You have swelling in your abdomen.   You feel faint or pass out.   You have blood in your urine.  You have a fever or persistent symptoms for more than 2-3 days.  You have a fever and your symptoms suddenly get worse. MAKE SURE YOU:    Understand these instructions.  Will watch your condition.  Will get help right away if you are not doing well or get worse. This information is not intended to replace advice given to you by your health care provider. Make sure you discuss any questions you have with your health care provider. Document Released: 06/09/2005 Document Revised: 01/11/2012 Document Reviewed: 01/20/2015 Elsevier Interactive Patient Education  2017 [employer]

## 2016-05-25 ENCOUNTER — Telehealth: Payer: Self-pay | Admitting: *Deleted

## 2016-05-25 MED ORDER — DOXYCYCLINE HYCLATE 100 MG PO TABS: 100.0000 mg | ORAL_TABLET | Freq: Two times a day (BID) | ORAL | 0 refills | Status: DC

## 2016-05-25 NOTE — Telephone Encounter (Signed)
Patient states this morning he now notices discharge coming from his penis and his urine now looks cloudy. Culture is still in process. We cannot add gc/chlamydia to urine culture. Please advise

## 2016-05-25 NOTE — Telephone Encounter (Signed)
I have added on doxycycline which will take care of Chlamydia. Doxycycline plus Omnicef will typically treat gonorrhea. We cannot retest at this time unfortunately.

## 2016-05-25 NOTE — Telephone Encounter (Signed)
Patient notified and voiced understanding;asked that if symptoms worsened or did not improve to call us back. Complete full course of antibiotics.

## 2016-05-26 LAB — URINE CULTURE: ORGANISM ID, BACTERIA: NO GROWTH

## 2016-05-27 ENCOUNTER — Ambulatory Visit: Payer: BC Managed Care – PPO | Admitting: Family Medicine

## 2017-05-11 ENCOUNTER — Telehealth: Payer: Self-pay | Admitting: Family Medicine

## 2017-05-11 DIAGNOSIS — Z130 Encounter for screening for diseases of the blood and blood-forming organs and certain disorders involving the immune mechanism: Secondary | ICD-10-CM

## 2017-05-11 NOTE — Telephone Encounter (Signed)
Pt called clinic stating his wife is pregnant and tested positive as for the sickle cell trait. Pt now needs to be tested. Routing to PCP for review.

## 2017-05-12 NOTE — Telephone Encounter (Signed)
Left message on patient vm advising that order has been placed for labs and advised patient to call the office if he had further questions. Rhonda Cunningham,CMA

## 2017-05-12 NOTE — Telephone Encounter (Signed)
Lab ordered. Can be done anytime without fasting.

## 2017-05-16 LAB — SICKLE CELL SCREEN: Sickle Solubility Test - HGBRFX: NEGATIVE

## 2017-07-12 ENCOUNTER — Encounter: Payer: Self-pay | Admitting: Family Medicine

## 2017-07-12 ENCOUNTER — Ambulatory Visit: Payer: BC Managed Care – PPO | Admitting: Family Medicine

## 2017-07-12 VITALS — BP 136/90 | HR 92 | Ht 71.0 in | Wt 177.0 lb

## 2017-07-12 DIAGNOSIS — I10 Essential (primary) hypertension: Secondary | ICD-10-CM | POA: Diagnosis not present

## 2017-07-12 DIAGNOSIS — L02214 Cutaneous abscess of groin: Secondary | ICD-10-CM | POA: Diagnosis not present

## 2017-07-12 DIAGNOSIS — Z23 Encounter for immunization: Secondary | ICD-10-CM

## 2017-07-12 MED ORDER — DOXYCYCLINE HYCLATE 100 MG PO TABS: 100.0000 mg | ORAL_TABLET | Freq: Two times a day (BID) | ORAL | 0 refills | Status: DC

## 2017-07-12 MED ORDER — MUPIROCIN 2 % EX OINT: TOPICAL_OINTMENT | CUTANEOUS | 3 refills | Status: DC

## 2017-07-12 NOTE — Progress Notes (Signed)
James Marshall is a 37 y.o. male who presents to [facility] today for abscess. Leticia notes over the past few weeks episodes of abscesses occurring on his groin and trunk and legs. He has done some research and suspects MRSA. He has a significantly painful swollen area in his left groin today that is just starting to drain. He denies any other current abscesses. He does not shave the skin in his groin. His medical history significant for sepsis due to pyelonephritis. He denies any current fevers or  Chills.  Additionally has a history of hypertension and ot currently taking amlodipine.  He is expecting the birth of his second child in July.   Past Medical History:  Diagnosis Date  . Acute pyelonephritis   . GERD (gastroesophageal reflux disease)   . Hypertension   . Normocytic anemia 7/17  . Sepsis (HCC) 7/17   Past Surgical History:  Procedure Laterality Date  . CYSTOSCOPY W/ URETERAL STENT PLACEMENT Right 11/07/2015   Procedure: CYSTOSCOPY WITH RETROGRADE AND URETERAL STENT PLACEMENT;  Surgeon: Bjorn Pippin, MD;  Location: [facility] OR;  Service: Urology;  Laterality: Right;  . CYSTOSCOPY/RETROGRADE/URETEROSCOPY/STONE EXTRACTION WITH BASKET Right 11/19/2015   Procedure: CYSTOSCOPY WITH RIGHT URETEROSCOPY, STONE BASKETRY;  Surgeon: Bjorn Pippin, MD;  Location: [facility] ORS;  Service: Urology;  Laterality: Right;   Social History   Tobacco Use  . Smoking status: Former Smoker    Packs/day: 1.00    Years: 10.00    Pack years: 10.00    Types: Cigarettes  . Smokeless tobacco: Former Engineer, water Use Topics  . Alcohol use: Yes    Comment: 2-3 times per week, no history of EtOH withdrawal   family history includes Cervical cancer in his mother; Diabetes in his paternal grandfather; Heart disease in his maternal grandfather; Prostate cancer in his paternal grandfather.  ROS as  above:  Medications: Current Outpatient Medications  Medication Sig Dispense Refill  . ibuprofen (ADVIL,MOTRIN) 400 MG tablet Take 1 tablet (400 mg total) by mouth every 8 (eight) hours as needed for fever, headache or moderate pain. 40 tablet 0  . naproxen (NAPROSYN) 500 MG tablet Take 1 tablet (500 mg total) by mouth 2 (two) times daily with a meal. 30 tablet 0  . tamsulosin (FLOMAX) 0.4 MG CAPS capsule Take 1 capsule (0.4 mg total) by mouth daily. 30 capsule 3  . doxycycline (VIBRA-TABS) 100 MG tablet Take 1 tablet (100 mg total) by mouth 2 (two) times daily. 20 tablet 0  . mupirocin ointment (BACTROBAN) 2 % To nostril twice daily for 2 weeks. 30 g 3   No current facility-administered medications for this visit.    Allergies  Allergen Reactions  . Hydrocodone Nausea And Vomiting    Health Maintenance Health Maintenance  Topic Date Due  . TETANUS/TDAP  08/05/1998  . INFLUENZA VACCINE  03/14/2018 (Originally 11/30/2016)  . HIV Screening  07/13/2018 (Originally 08/05/1994)     Exam:  BP 136/90   Pulse 92   Ht 5\' 11"  (1.803 m)   Wt 177 lb (80.3 kg)   BMI 24.69 kg/m  Gen: Well NAD HEENT: EOMI,  MMM Lungs: Normal work of breathing. CTABL Heart: RRR no MRG Abd: NABS, Soft. Nondistended, Nontender Exts: Brisk capillary refill, warm and well perfused.  Skin left groin:  Indurated skin surrounding a central area of fluctuance at the left inguinal groin. Tender palpation with expressible pus.  Abscess incision and drainage. Consent  obtained and timeout performed. Skin cleaned with alcohol, and cold spray applied. 3 mL of lidocaine with with epi injected achieving good anesthesia. Skin was again cleaned with alcohol. A sharp incision was made to the area of fluctuance. The incision was widened and pus was expressed. Pus was cultured. Blunt dissection was used to break up loculations. Further pus was expressed. Patient tolerated the procedure well. A dressing was applied  REVIEW  OF Labs: MRSA screening nasal swab PCR negative 11/07/15  No results found for this or any previous visit (from the past 72 hour(s)). No results found.    Assessment and Plan: 37 y.o. male with  Left groin abscess likely MRSA based on history of recurrent abscess recently.  Abscess incised and drained. Culture pending. 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   HTN: Recommend follow up in the near future.   Tdap given today.   Orders Placed This Encounter  Procedures  . Culture, routine-abscess    Left inguinal abscess  . WOUND CULTURE    Order Specific Question:   Source    Answer:   LEFT INGUINAL ABSCESS  . Tdap vaccine greater than or equal to 7yo IM   Meds ordered this encounter  Medications  . doxycycline (VIBRA-TABS) 100 MG tablet    Sig: Take 1 tablet (100 mg total) by mouth 2 (two) times daily.    Dispense:  20 tablet    Refill:  0  . mupirocin ointment (BACTROBAN) 2 %    Sig: To nostril twice daily for 2 weeks.    Dispense:  30 g    Refill:  3     Discussed warning signs or symptoms. Please see discharge instructions. Patient expresses understanding.

## 2017-07-12 NOTE — Progress Notes (Signed)
2nd note accidentally created.

## 2017-07-12 NOTE — Patient Instructions (Addendum)
Thank you for coming in today. Take the doxycycline twice daily for 10 days.  Use the ointment in your nose for 2 weeks.  Use over the counter Chlorhexidine (Hibiclens) Use in the shower until all the bumps are gone + 1 week.  Pay close attention to bumps and in your groin, buttock and arm pits.   Recheck as needed.    Or yearly for physical.    Percutaneous Abscess Drain, Care After This sheet gives you information about how to care for yourself after your procedure. Your health care provider may also give you more specific instructions. If you have problems or questions, contact your health care provider. What can I expect after the procedure? After your procedure, it is common to have:  A small amount of bruising and discomfort in the area where the drainage tube (catheter) was placed.  Sleepiness and fatigue. This should go away after the medicines you were given have worn off.  Follow these instructions at home: Incision care  Follow instructions from your health care provider about how to take care of your incision. Make sure you: ? Wash your hands with soap and water before you change your bandage (dressing). If soap and water are not available, use hand sanitizer. ? Change your dressing as told by your health care provider. ? Leave stitches (sutures), skin glue, or adhesive strips in place. These skin closures may need to stay in place for 2 weeks or longer. If adhesive strip edges start to loosen and curl up, you may trim the loose edges. Do not remove adhesive strips completely unless your health care provider tells you to do that.  Check your incision area every day for signs of infection. Check for: ? More redness, swelling, or pain. ? More fluid or blood. ? Warmth. ? Pus or a bad smell. ? Fluid leaking from around your catheter (instead of fluid draining through your catheter). Catheter care  Follow instructions from your health care provider about emptying and  cleaning your catheter and collection bag. You may need to clean the catheter every day so it does not clog.  If directed, write down the following information every time you empty your bag: ? The date and time. ? The amount of drainage. General instructions  Rest at home for 1-2 days after your procedure. Return to your normal activities as told by your health care provider.  Do not take baths, swim, or use a hot tub for 24 hours after your procedure, or until your health care provider says that this is okay.  Take over-the-counter and prescription medicines only as told by your health care provider.  Keep all follow-up visits as told by your health care provider. This is important. Contact a health care provider if:  You have less than 10 mL of drainage a day for 2-3 days in a row, or as directed by your health care provider.  You have more redness, swelling, or pain around your incision area.  You have more fluid or blood coming from your incision area.  Your incision area feels warm to the touch.  You have pus or a bad smell coming from your incision area.  You have fluid leaking from around your catheter (instead of through your catheter).  You have a fever or chills.  You have pain that does not get better with medicine. Get help right away if:  Your catheter comes out.  You suddenly stop having drainage from your catheter.  You suddenly have  blood in the fluid that is draining from your catheter.  You become dizzy or you faint.  You develop a rash.  You have nausea or vomiting.  You have difficulty breathing or you feel short of breath.  You develop chest pain.  You have problems with your speech or vision.  You have trouble balancing or moving your arms or legs. Summary  It is common to have a small amount of bruising and discomfort in the area where the drainage tube (catheter) was placed.  You may be directed to record the amount of drainage from the  bag every time you empty it.  Follow instructions from your health care provider about emptying and cleaning your catheter and collection bag. This information is not intended to replace advice given to you by your health care provider. Make sure you discuss any questions you have with your health care provider. Document Released: 09/02/2013 Document Revised: 03/10/2016 Document Reviewed: 03/10/2016 Elsevier Interactive Patient Education  2017 Elsevier Inc.    Community-Associated MRSA MRSA stands for methicillin-resistant Staphylococcus aureus. It is a type ofinfection caused by bacteria that are no longer affected by common antibiotic medicines (drug-resistant bacteria). Infections with MRSA can occur in hospitals and other health care settings (healthcare-associated MRSA), and in the community (community-associated MRSA, or CA-MRSA). MRSA bacteria can spread from person to person by touching contaminated objects or through direct contact. Infections with MRSA may be very serious or even life-threatening. What are the causes? Staphylococcus aureus bacteria normally live on the skin or in the nose of some people. This usually does not cause problems. However, a MRSA infection can happen if the bacterium enters the body through a cut, wound, or break in the skin. What increases the risk? The following factors may make you more likely to get a CA-MRSA infection:  Close skin-to-skin contact with others.  Cuts and scratches that are not treated, not covered, or both.  Recent antibiotic medicine use.  Sharing contaminated towels or clothes.  Having active skin conditions.  Participating in contact sports.  Living in crowded settings.  Homelessness.  IV drug use.  What are the signs or symptoms? This condition usually starts with a skin infection. Signs and symptoms vary, and may include:  An area of skin that is red and swollen, feels painful, and is warm to the touch.  Pus under  the skin or pus draining from the infected area.  Fever.  CA-MRSA infections are usually skin infections, but in some cases, severe illness may develop, such as:  Pneumonia.  Bone or joint infections.  Bloodstream infections (sepsis).  Symptoms may vary as the infection gets worse. How is this diagnosed? This condition is diagnosed by taking a fluid sample (culture). The culture may come from:  Swabs of cuts or wounds in infected areas.  Nasal swabs.  Saliva or deep-cough specimens from the lungs (sputum).  Urine.  Blood.  You may have imaging tests to check whether the infection has spread. Imaging tests may include:  X-rays.  MRI.  CT scan.  How is this treated? Treatment varies and is based on how serious, deep, and extensive the infection is. For example:  Some skin infections, such as a small boil or abscess, may be treated by draining pus from the site of the infection.  Deeper or more widespread soft tissue infections are usually treated with surgery to drain pus and with antibiotics that are given through a vein or by mouth. This may be recommended even if  you are pregnant.  Serious infections may require a hospital stay.  If antibiotics are prescribed, you may need to take them for several weeks. Follow these instructions at home: Medicines  Take your antibiotic as told by your health care provider. Do not stop taking the antibiotic even if you start to feel better.  Take over-the-counter and prescription medicines only as told by your health care provider. General instructions  Wash your hands with soap and water often.Ask anyone who lives with you to wash their hands often, too. If soap and water are not available, use hand sanitizer.  Do not use towels, razors, toothbrushes, bedding, or other items that will be used by others.  Follow instructions from your health care provider about how to take care of your wound. Make sure you: ? Wash your hands  with soap and water before you change your bandage (dressing). If soap and water are not available, use hand sanitizer. ? Change your dressing as told by your health care provider. ? Leave stitches (sutures), skin glue, or adhesive strips in place. These skin closures may need to be in place for 2 weeks or longer. If adhesive strip edges start to loosen and curl up, you may trim the loose edges. Do not remove adhesive strips completely unless your health care provider tells you to do that.  Tell any health care providers who care for you that you have MRSA so they are aware of your infection.  Keep all follow-up visits as told by your health care provider. This is important. How is this prevented?   Wash your hands frequently with soap and water for at least 20 seconds. If soap and water are not available, use hand sanitizer. Make sure that everyone in your household washes their hands, too.  Wash and dry your clothes and bedding at the warmest temperatures that are recommended on the labels.  Maintain good hygiene by bathing often and keeping your body clean.  Clean wounds, cuts, and abrasions with soap and water and cover them with dry, germ-free (sterile) dressings until they heal.  If you have a wound that seems to be infected, ask your health care provider if a culture should be done for MRSA and other bacteria.  If you are breastfeeding, talk to your health care provider about MRSA. You may be asked to temporarily stop breastfeeding. Contact a health care provider if:  Your infection seems to be getting worse. Signs may include: ? More warmth, redness, or tenderness around your wound site. ? A red line that spreads from your infection site. ? A dark color in the area around your infection. ? Wound drainage that is tan, yellow, or green. ? A bad smell coming from your wound.  You feel nauseous, you vomit, or you cannot take medicine without vomiting.  You have a fever.  You  have difficulty breathing. This information is not intended to replace advice given to you by your health care provider. Make sure you discuss any questions you have with your health care provider. Document Released: 07/22/2005 Document Revised: 12/11/2015 Document Reviewed: 04/23/2015 Elsevier Interactive Patient Education  2017 [employer].

## 2017-07-13 DIAGNOSIS — Z23 Encounter for immunization: Secondary | ICD-10-CM | POA: Diagnosis not present

## 2017-07-15 LAB — WOUND CULTURE
MICRO NUMBER:: 90320221
SPECIMEN QUALITY:: ADEQUATE

## 2017-07-18 ENCOUNTER — Encounter: Payer: Self-pay | Admitting: Family Medicine

## 2017-07-18 DIAGNOSIS — Z22322 Carrier or suspected carrier of Methicillin resistant Staphylococcus aureus: Secondary | ICD-10-CM

## 2017-07-18 HISTORY — DX: Carrier or suspected carrier of methicillin resistant Staphylococcus aureus: Z22.322

## 2017-08-14 ENCOUNTER — Encounter: Payer: Self-pay | Admitting: Family Medicine

## 2017-08-14 ENCOUNTER — Ambulatory Visit (INDEPENDENT_AMBULATORY_CARE_PROVIDER_SITE_OTHER): Payer: BC Managed Care – PPO | Admitting: Family Medicine

## 2017-08-14 VITALS — BP 140/89 | HR 78 | Temp 98.3°F | Ht 71.0 in | Wt 178.0 lb

## 2017-08-14 DIAGNOSIS — L02416 Cutaneous abscess of left lower limb: Secondary | ICD-10-CM | POA: Diagnosis not present

## 2017-08-14 DIAGNOSIS — Z22322 Carrier or suspected carrier of Methicillin resistant Staphylococcus aureus: Secondary | ICD-10-CM | POA: Diagnosis not present

## 2017-08-14 DIAGNOSIS — I10 Essential (primary) hypertension: Secondary | ICD-10-CM | POA: Diagnosis not present

## 2017-08-14 DIAGNOSIS — E876 Hypokalemia: Secondary | ICD-10-CM | POA: Diagnosis not present

## 2017-08-14 MED ORDER — MUPIROCIN 2 % EX OINT: TOPICAL_OINTMENT | CUTANEOUS | 3 refills | Status: DC

## 2017-08-14 MED ORDER — DOXYCYCLINE HYCLATE 100 MG PO TABS
100.0000 mg | ORAL_TABLET | Freq: Two times a day (BID) | ORAL | 1 refills | Status: DC
Start: 2017-08-14 — End: 2019-12-05

## 2017-08-14 NOTE — Patient Instructions (Addendum)
Thank you for coming in today. Re-do Doxy.  Continue hibiclenz and antibiotic ointment.  Hopefull this will stop.   Schedule a well adult visit in the near future to discuss blood pressure etc.  Get fasting labs in the near future.  The lab is open at 730 am.    Skin Abscess A skin abscess is an infected area on or under your skin that contains a collection of pus and other material. An abscess may also be called a furuncle, carbuncle, or boil. An abscess can occur in or on almost any part of your body. Some abscesses break open (rupture) on their own. Most continue to get worse unless they are treated. The infection can spread deeper into the body and eventually into your blood, which can make you feel ill. Treatment usually involves draining the abscess. What are the causes? An abscess occurs when germs, often bacteria, pass through your skin and cause an infection. This may be caused by:  A scrape or cut on your skin.  A puncture wound through your skin, including a needle injection.  Blocked oil or sweat glands.  Blocked and infected hair follicles.  A cyst that forms beneath your skin (sebaceous cyst) and becomes infected.  What increases the risk? This condition is more likely to develop in people who:  Have a weak body defense system (immune system).  Have diabetes.  Have dry and irritated skin.  Get frequent injections or use illegal IV drugs.  Have a foreign body in a wound, such as a splinter.  Have problems with their lymph system or veins.  What are the signs or symptoms? An abscess may start as a painful, firm bump under the skin. Over time, the abscess may get larger or become softer. Pus may appear at the top of the abscess, causing pressure and pain. It may eventually break through the skin and drain. Other symptoms include:  Redness.  Warmth.  Swelling.  Tenderness.  A sore on the skin.  How is this diagnosed? This condition is diagnosed based  on your medical history and a physical exam. A sample of pus may be taken from the abscess to find out what is causing the infection and what antibiotics can be used to treat it. You also may have:  Blood tests to look for signs of infection or spread of an infection to your blood.  Imaging studies such as ultrasound, CT scan, or MRI if the abscess is deep.  How is this treated? Small abscesses that drain on their own may not need treatment. Treatment for an abscess that does not rupture on its own may include:  Warm compresses applied to the area several times per day.  Incision and drainage. Your health care provider will make an incision to open the abscess and will remove pus and any foreign body or dead tissue. The incision area may be packed with gauze to keep it open for a few days while it heals.  Antibiotic medicines to treat infection. For a severe abscess, you may first get antibiotics through an IV and then change to oral antibiotics.  Follow these instructions at home: Abscess Care  If you have an abscess that has not drained, place a warm, clean, wet washcloth over the abscess several times a day. Do this as told by your health care provider.  Follow instructions from your health care provider about how to take care of your abscess. Make sure you: ? Cover the abscess with a bandage (  dressing). ? Change your dressing or gauze as told by your health care provider. ? Wash your hands with soap and water before you change the dressing or gauze. If soap and water are not available, use hand sanitizer.  Check your abscess every day for signs of a worsening infection. Check for: ? More redness, swelling, or pain. ? More fluid or blood. ? Warmth. ? More pus or a bad smell. Medicines  Take over-the-counter and prescription medicines only as told by your health care provider.  If you were prescribed an antibiotic medicine, take it as told by your health care provider. Do not stop  taking the antibiotic even if you start to feel better. General instructions  To avoid spreading the infection: ? Do not share personal care items, towels, or hot tubs with others. ? Avoid making skin contact with other people.  Keep all follow-up visits as told by your health care provider. This is important. Contact a health care provider if:  You have more redness, swelling, or pain around your abscess.  You have more fluid or blood coming from your abscess.  Your abscess feels warm to the touch.  You have more pus or a bad smell coming from your abscess.  You have a fever.  You have muscle aches.  You have chills or a general ill feeling. Get help right away if:  You have severe pain.  You see red streaks on your skin spreading away from the abscess. This information is not intended to replace advice given to you by your health care provider. Make sure you discuss any questions you have with your health care provider. Document Released: 01/26/2005 Document Revised: 12/13/2015 Document Reviewed: 02/25/2015 Elsevier Interactive Patient Education  [employer]2018 Elsevier Inc.

## 2017-08-14 NOTE — Progress Notes (Signed)
James Marshall is a 37 y.o. male who [facility] today for abscess on left thigh.   Mr. Vedia CofferBlack notes a abscess on his left inner thigh present for about 3 days consistent with previous episodes of MRSA abscesses.  He was seen about a month ago for another abscess found to be MRSA.  He was treated with doxycycline Hibiclens body wash, and mupirocin antibiotic ointment which worked well.  He had much fewer abscesses and folliculitis as of the last month.  He denies fevers or chills.  He no longer is taking medications for blood pressure.  He denies chest pain palpitations or shortness of breath.  His medical history is pertinent for sepsis due to kidney stone.   Past Medical History:  Diagnosis Date  . Acute pyelonephritis   . GERD (gastroesophageal reflux disease)   . Hypertension   . 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 Normocytic anemia 7/17  . Sepsis (HCC) 7/17   Past Surgical History:  Procedure Laterality Date  . CYSTOSCOPY W/ URETERAL STENT PLACEMENT Right 11/07/2015   Procedure: CYSTOSCOPY WITH RETROGRADE AND URETERAL STENT PLACEMENT;  Surgeon: Bjorn PippinJohn Wrenn, MD;  [facility];  Service: Urology;  Laterality: Right;  . CYSTOSCOPY/RETROGRADE/URETEROSCOPY/STONE EXTRACTION WITH BASKET Right 11/19/2015   Procedure: CYSTOSCOPY WITH RIGHT URETEROSCOPY, STONE BASKETRY;  Surgeon: Bjorn PippinJohn Wrenn, MD;  Location: [facility] ORS;  Service: Urology;  Laterality: Right;   Social History   Tobacco Use  . Smoking status: Former Smoker    Packs/day: 1.00    Years: 10.00    Pack years: 10.00    Types: Cigarettes  . Smokeless tobacco: Former Engineer, waterUser  Substance Use Topics  . Alcohol use: Yes    Comment: 2-3 times per week, no history of EtOH withdrawal   family history includes Cervical cancer in his mother; Diabetes in his paternal grandfather; Heart disease in his maternal  grandfather; Prostate cancer in his paternal grandfather.  ROS as above:  Medications: Current Outpatient Medications  Medication Sig Dispense Refill  . doxycycline (VIBRA-TABS) 100 MG tablet Take 1 tablet (100 mg total) by mouth 2 (two) times daily. 20 tablet 1  . ibuprofen (ADVIL,MOTRIN) 400 MG tablet Take 1 tablet (400 mg total) by mouth every 8 (eight) hours as needed for fever, headache or moderate pain. 40 tablet 0  . mupirocin ointment (BACTROBAN) 2 % To nostril twice daily for 2 weeks. 30 g 3  . naproxen (NAPROSYN) 500 MG tablet Take 1 tablet (500 mg total) by mouth 2 (two) times daily with a meal. 30 tablet 0   No current facility-administered medications for this visit.    Allergies  Allergen Reactions  . Hydrocodone Nausea And Vomiting    Health Maintenance Health Maintenance  Topic Date Due  . INFLUENZA VACCINE  03/14/2018 (Originally 11/30/2017)  . HIV Screening  07/13/2018 (Originally 08/05/1994)  . TETANUS/TDAP  07/14/2027     Exam:  BP 140/89   Pulse 78   Temp 98.3 F (36.8 C) (Oral)   Ht 5\' 11"  (1.803 m)   Wt 178 lb (80.7 kg)   BMI 24.83 kg/m  Gen: Well NAD HEENT: EOMI,  MMM Lungs: Normal work of breathing. CTABL Heart: RRR no MRG Abd: NABS, Soft. Nondistended, Nontender Exts: Brisk capillary refill, warm and well perfused.  Skin: Abscess with surrounding erythema left inner thigh.  No expressible pus present.  Abscess incision and drainage. Consent obtained and timeout performed. Skin cleaned with  alcohol, and cold spray applied. 3 mL of lidocaine with epi injected achieving good anesthesia. Skin was again cleaned with alcohol. A sharp incision was made to the area of fluctuance. The incision was widened and pus was expressed. Pus was cultured. Blunt dissection was used to break up loculations. Further pus was expressed. Patient tolerated the procedure well. A dressing was applied      Assessment and Plan: 37 y.o. male with abscess incised and  drained and cultured.  Repeat course of doxycycline and mupirocin.  Recheck as needed.  As for hypertension not currently on management today.  We will check metabolic panel as well as lipid panel and return for well adult visit in the near future discussed need for blood pressure medications.  He is borderline currently I think probably would benefit from low-dose medications.   Orders Placed This Encounter  Procedures  . Wound culture    Order Specific Question:   Source    Answer:   left thigh abscess prior culture MRSA  . CBC  . COMPLETE METABOLIC PANEL WITH GFR  . Lipid Panel w/reflex Direct LDL   Meds ordered this encounter  Medications  . mupirocin ointment (BACTROBAN) 2 %    Sig: To nostril twice daily for 2 weeks.    Dispense:  30 g    Refill:  3  . doxycycline (VIBRA-TABS) 100 MG tablet    Sig: Take 1 tablet (100 mg total) by mouth 2 (two) times daily.    Dispense:  20 tablet    Refill:  1     Discussed warning signs or symptoms. Please see discharge instructions. Patient expresses understanding.

## 2017-08-17 LAB — WOUND CULTURE
MICRO NUMBER: 90462485
SPECIMEN QUALITY:: ADEQUATE

## 2017-10-16 IMAGING — CR DG CHEST 1V PORT
1 series · 1 of 1 positions shown · non-contrast
Comparison: Thoracic spine radiographs performed 03/21/2009

CLINICAL DATA: Acute onset of sepsis.  Initial encounter.

EXAM:
PORTABLE CHEST 1 VIEW

[AP]
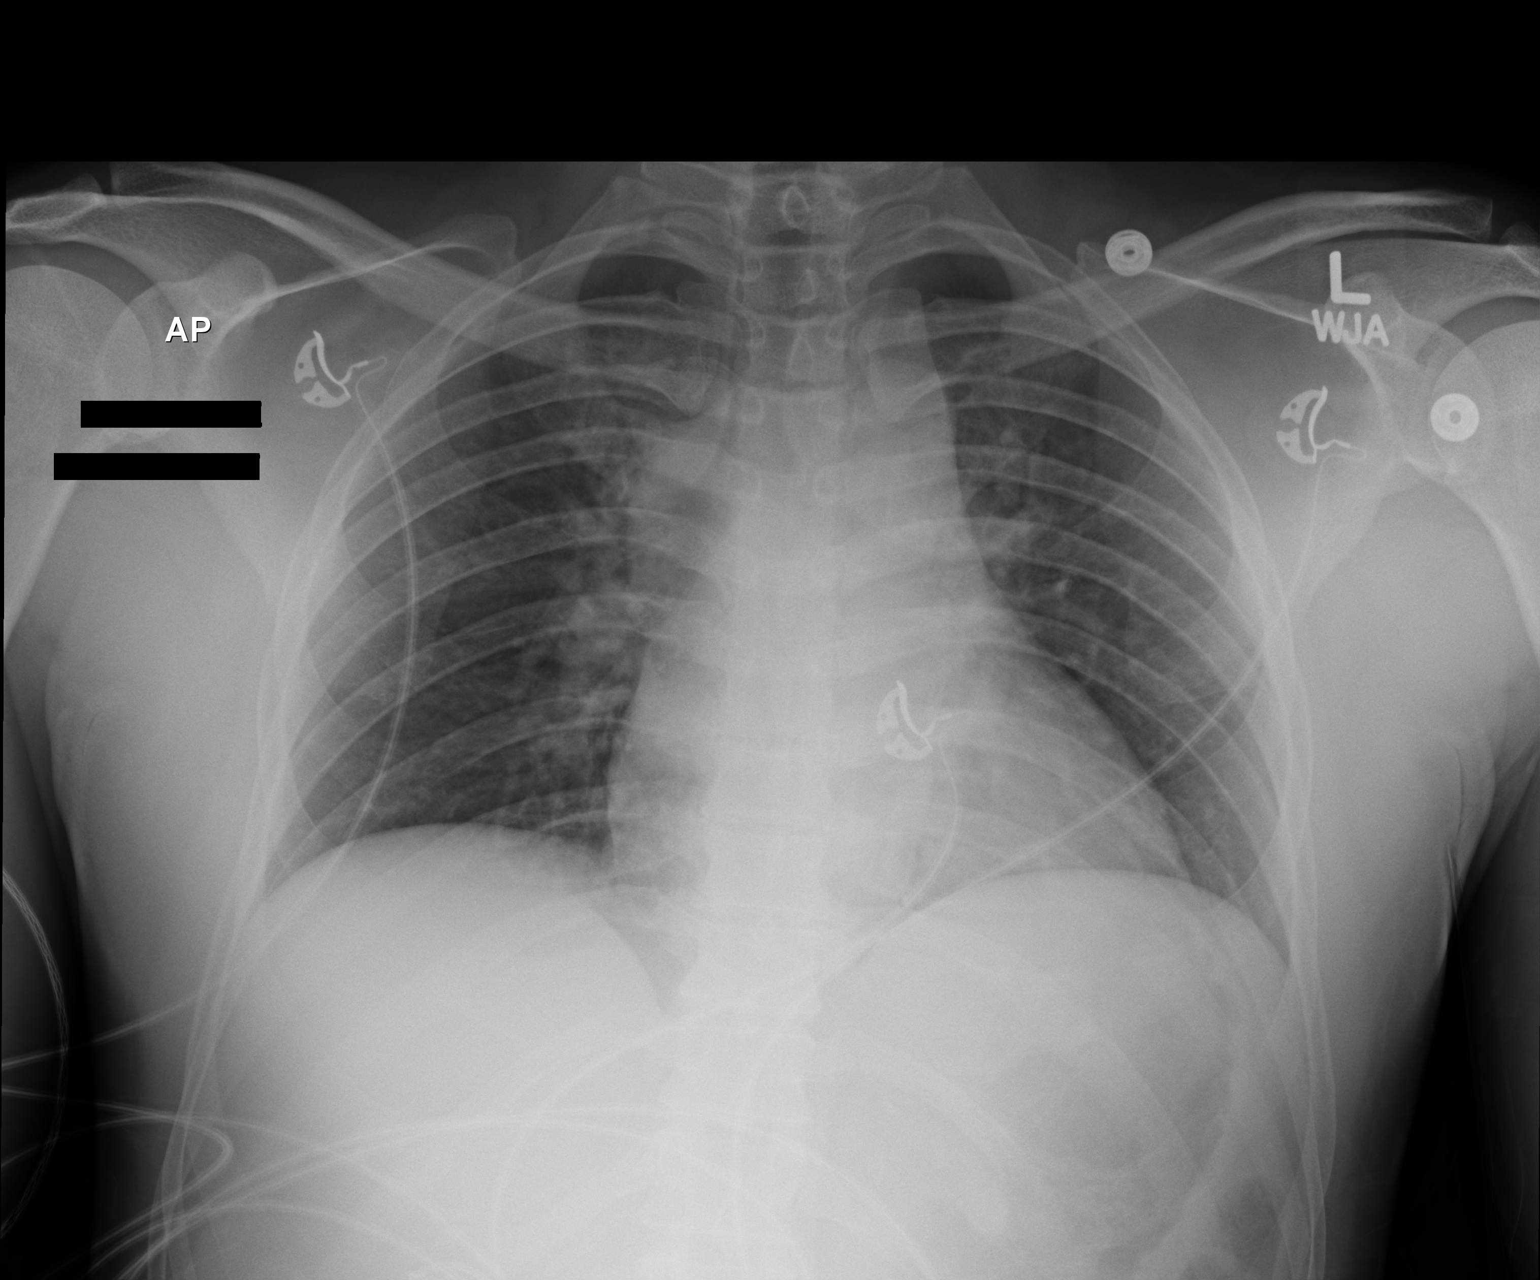

[1 of 1 positions shown; findings below may reference images not displayed]

FINDINGS: The lungs are well-aerated. Mild vascular congestion is suggested.
There is no evidence of focal opacification, pleural effusion or
pneumothorax.

The cardiomediastinal silhouette is borderline normal in size. No
acute osseous abnormalities are seen.
IMPRESSION: Mild vascular congestion suggested.  Lungs remain grossly clear.

## 2018-04-14 IMAGING — US US RENAL
1 series · 14 of 25 positions shown · non-contrast
Comparison: 11/07/2015

CLINICAL DATA: Fevers for 2 days, history of pyelonephritis

EXAM:
RENAL / URINARY TRACT ULTRASOUND COMPLETE

[Series 1: us renal · 0.25mm/px · 14 of 32 slices shown]
[im 1/32]
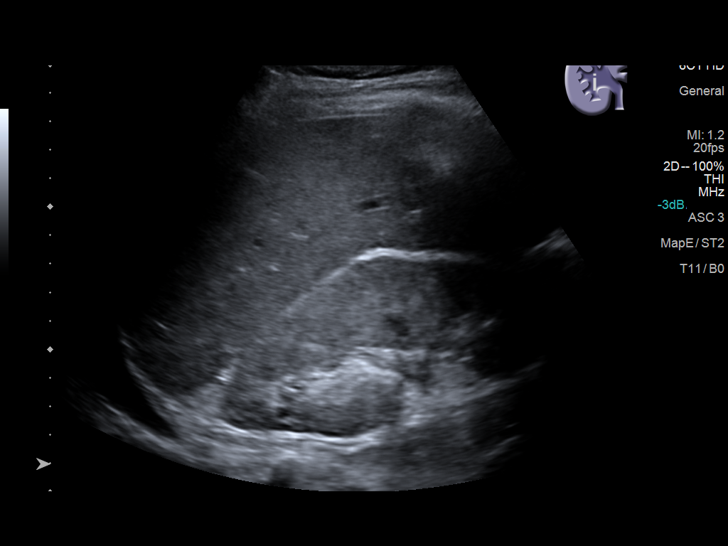
[im 3/32]
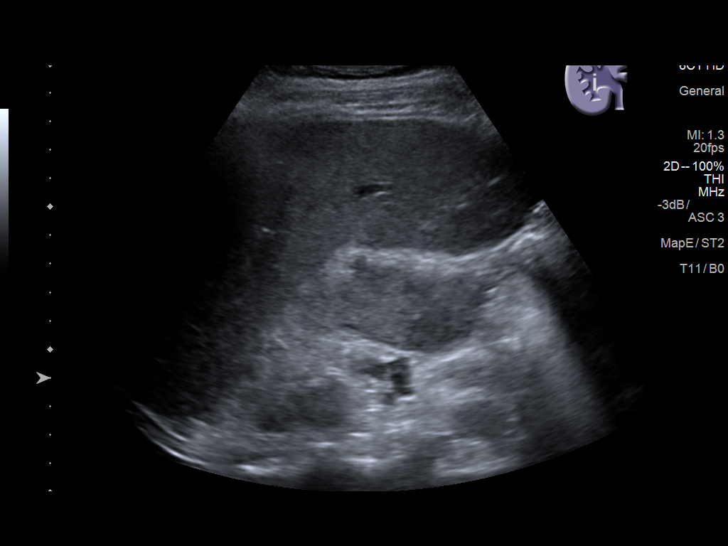
[im 6/32]
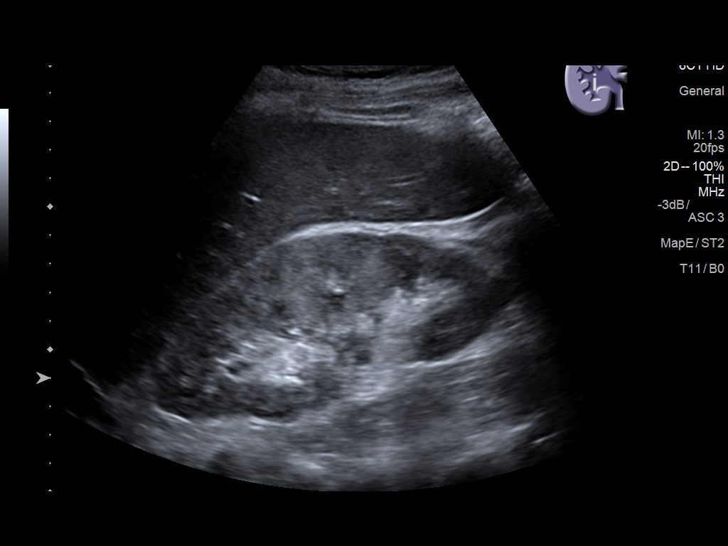
[im 8/32]
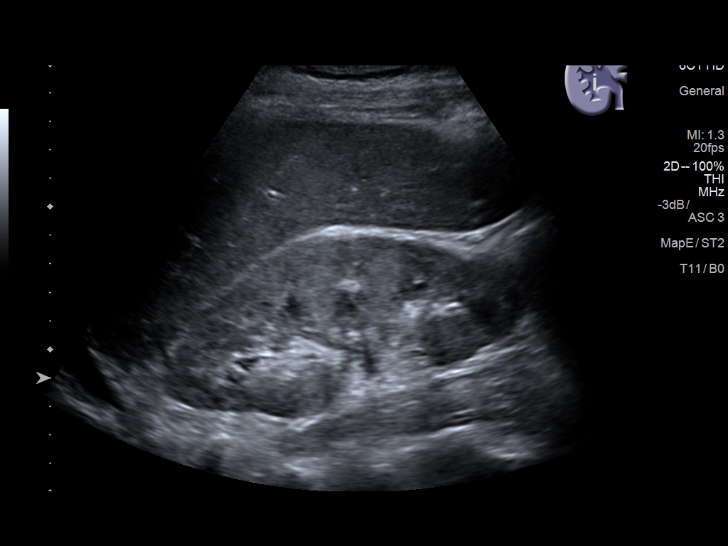
[im 11/32]
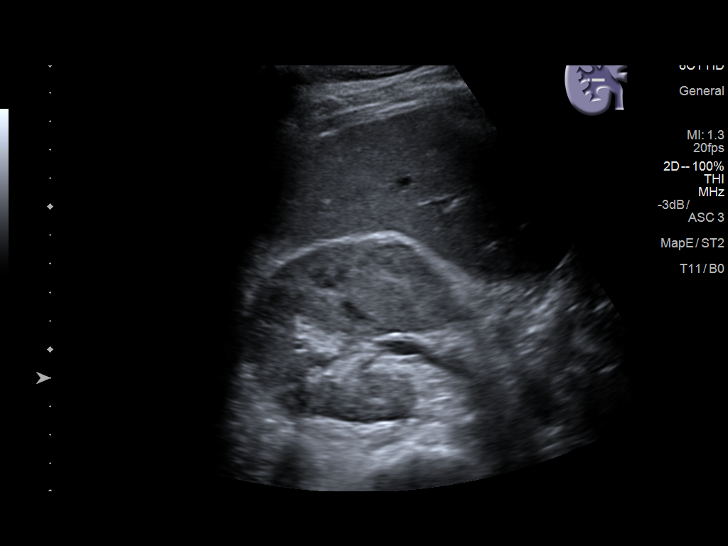
[im 12/32]
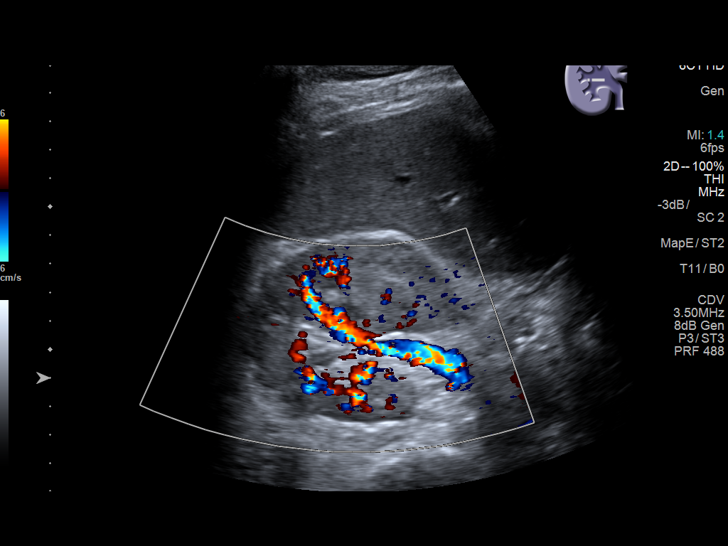
[im 15/32]
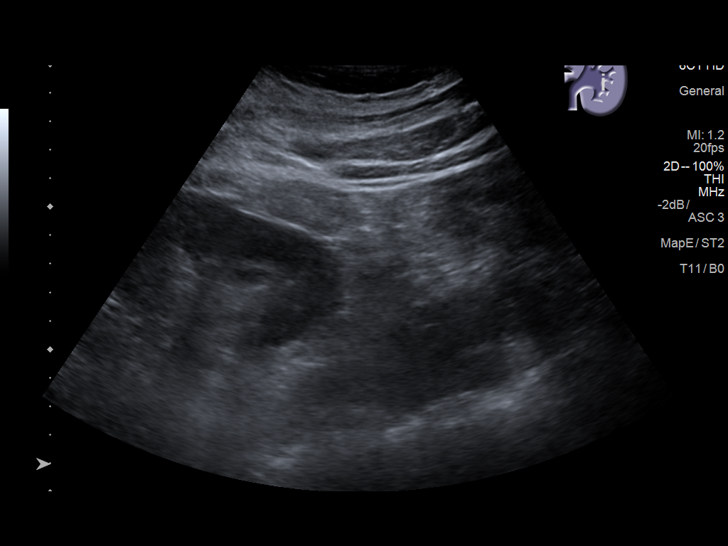
[im 17/32]
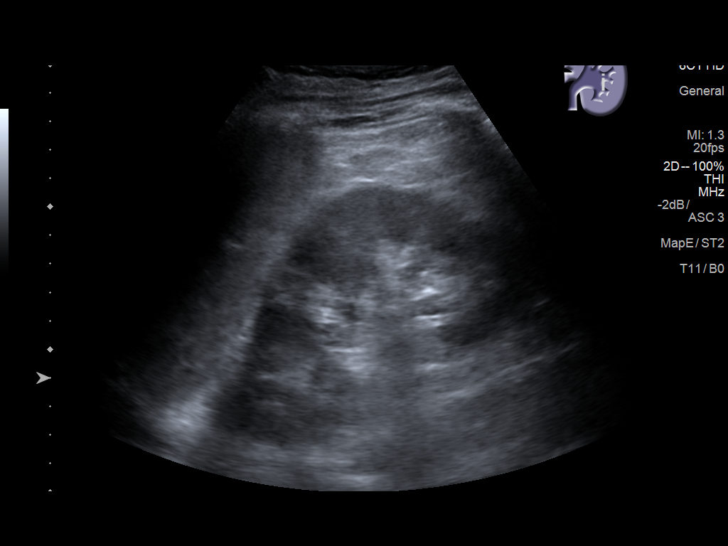
[im 20/32]
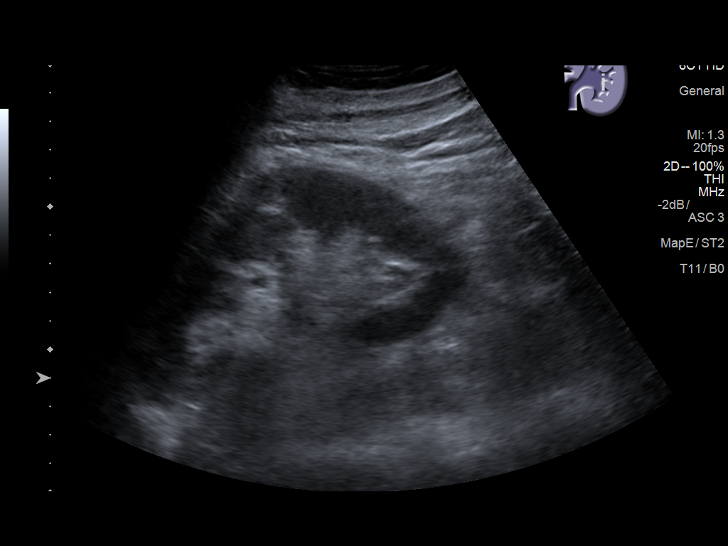
[im 21/32]
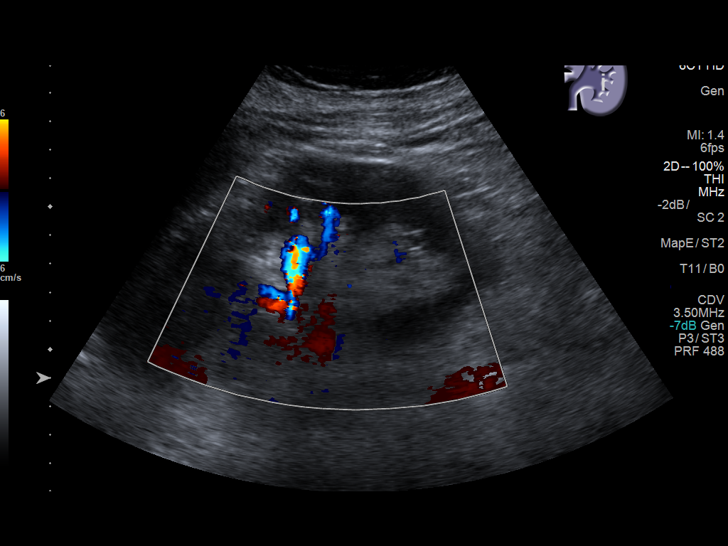
[im 24/32]
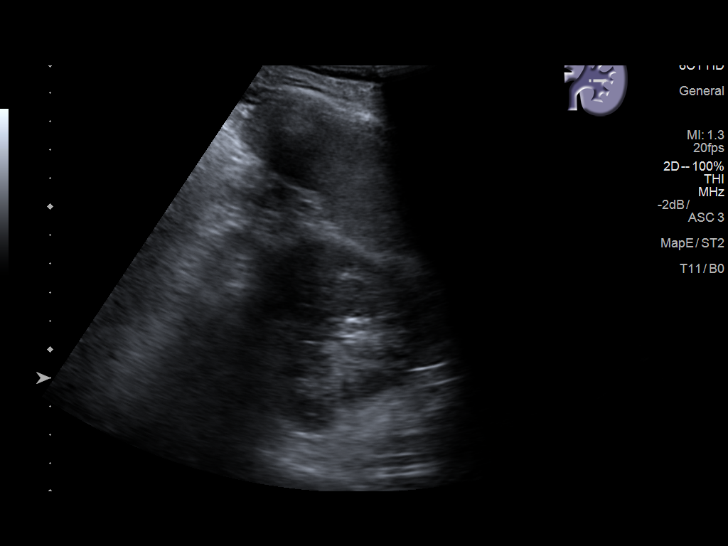
[im 26/32]
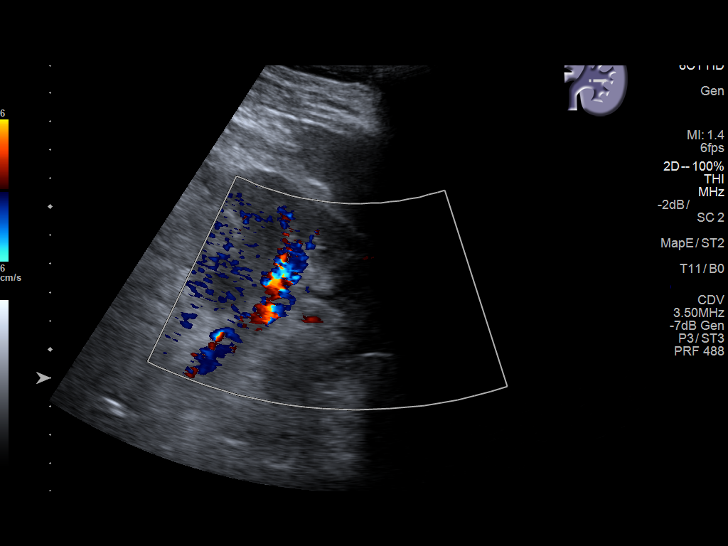
[im 29/32]
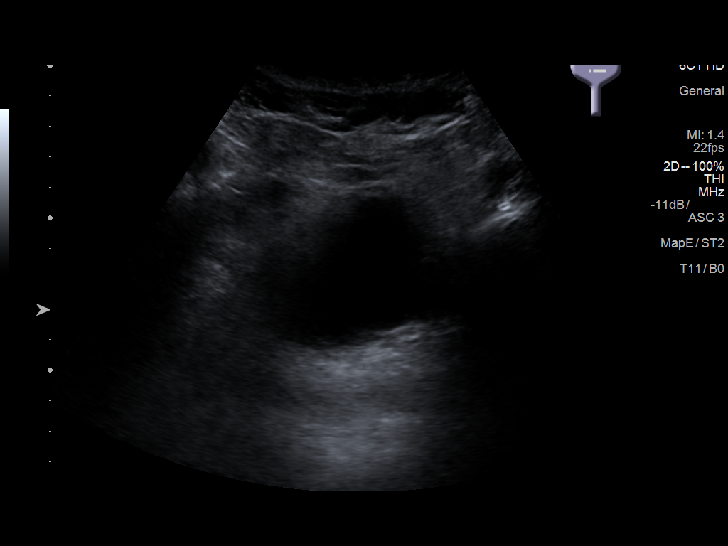
[im 32/32]
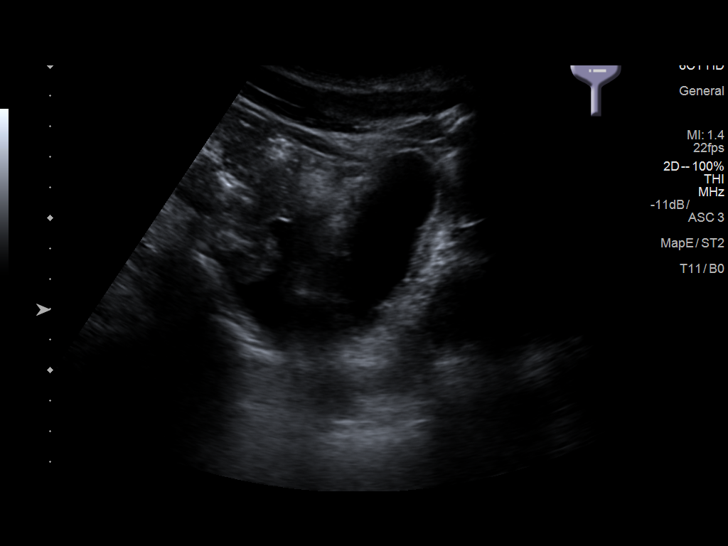

[14 of 25 positions shown; findings below may reference images not displayed]

FINDINGS: Right Kidney:

Length: 13.3 cm.. Echogenicity within normal limits. No mass or
hydronephrosis visualized.

Left Kidney:

Length: 12.6 cm.. Echogenicity within normal limits. No mass or
hydronephrosis visualized.

Bladder:

Appears normal for degree of bladder distention.

Note made of small pleural effusions as well as very mild ascites.
IMPRESSION: No focal abnormality is noted within the kidneys.

Small pleural effusions and mild ascites.

## 2018-05-08 ENCOUNTER — Ambulatory Visit: Payer: BC Managed Care – PPO | Admitting: Family Medicine

## 2018-11-08 ENCOUNTER — Encounter: Payer: Self-pay | Admitting: Family Medicine

## 2018-11-08 ENCOUNTER — Other Ambulatory Visit: Payer: Self-pay

## 2018-11-08 ENCOUNTER — Ambulatory Visit (INDEPENDENT_AMBULATORY_CARE_PROVIDER_SITE_OTHER): Payer: BC Managed Care – PPO | Admitting: Family Medicine

## 2018-11-08 VITALS — BP 156/93 | HR 84 | Temp 98.6°F | Ht 70.0 in | Wt 181.0 lb

## 2018-11-08 DIAGNOSIS — D649 Anemia, unspecified: Secondary | ICD-10-CM | POA: Diagnosis not present

## 2018-11-08 DIAGNOSIS — K602 Anal fissure, unspecified: Secondary | ICD-10-CM

## 2018-11-08 DIAGNOSIS — E876 Hypokalemia: Secondary | ICD-10-CM

## 2018-11-08 DIAGNOSIS — I10 Essential (primary) hypertension: Secondary | ICD-10-CM

## 2018-11-08 MED ORDER — POLYETHYLENE GLYCOL 3350 17 GM/SCOOP PO POWD: 17.0000 g | Freq: Every day | ORAL | 1 refills | Status: DC

## 2018-11-08 MED ORDER — LOSARTAN POTASSIUM 50 MG PO TABS: 50.0000 mg | ORAL_TABLET | Freq: Every day | ORAL | 0 refills | Status: DC

## 2018-11-08 MED ORDER — DILTIAZEM GEL 2 %: 1.0000 "application " | Freq: Two times a day (BID) | CUTANEOUS | 12 refills | Status: DC

## 2018-11-08 NOTE — Patient Instructions (Addendum)
Thank you for coming in today.  Apply diltiazem gel 2x daily for rectal fissue.  Use meiralax for soft BM.  Use warm bath daily or twice daily.   For blood pressure get labs now.  Start losartan and recheck in 2-3 months.  Return sooner if needed.     Anal Fistula  An anal fistula is a hole that develops between the bowel and the skin near the anus. The anus allows stool (feces) to leave the body. The anus has many tiny glands that make lubricating fluid. Sometimes, these glands become plugged and infected. This can cause a fluid-filled pocket (abscess) to form. An anal fistula often occurs when an abscess becomes infected and then develops into a hole between the bowel and the skin. What are the causes? In most cases, an anal fistula is caused by a past or current buildup of pus around the anus (anal abscess). Other causes include:  A complication of surgery.  Injury to the rectum or the area around it.  Using high-energy beams (radiation) to treat the area around the rectum. What increases the risk? You are more likely to develop this condition if you have certain medical conditions or diseases, including:  Chronic inflammatory bowel disease, such as Crohn's disease or ulcerative colitis.  Colon cancer or rectal cancer.  Diverticular disease, such as diverticulitis.  A sexually transmitted infection, or STI, such as gonorrhea, chlamydia, or syphilis.  An infection that is caused by HIV. What are the signs or symptoms? Symptoms of this condition include:  Throbbing or constant pain that may be worse while you are sitting.  Swelling or irritation around the anus.  Pus or blood from an opening near the anus.  Pain when passing stool.  Fever or chills. How is this diagnosed? This condition is diagnosed based on:  A physical exam. This may include: ? An exam to find the external opening of the fistula. ? An exam with a probe or scope to help locate the internal  opening of the fistula. ? An exam of the rectum with a gloved hand (digital rectal exam).  Imaging tests that use dye to find the exact location and path of the fistula. Tests may include: ? X-rays. ? Ultrasound. ? CT scan. ? MRI.  Other tests to find the cause of the anal fistula. How is this treated? This condition is most commonly treated with surgery. The type of surgery that is used will depend on where the fistula is located and how complex the fistula is. Surgery may include:  A fistulotomy. The whole fistula is opened up, and the contents are drained to promote healing.  Seton placement. A silk string (seton) is placed into the fistula during a fistulotomy. This helps to drain any infection and promote healing.  Advancement flap procedure. Tissue is removed from your rectum or the skin around the anus and attached to the opening of the fistula.  Bioprosthetic plug. A cone-shaped plug is made from your tissue and is used to block the opening of the fistula. Some anal fistulas do not require surgery. A nonsurgical treatment option involves injecting a fibrin glue to seal the fistula. You also may be prescribed an antibiotic medicine to treat any infection. Follow these instructions at home: Medicines  Take over-the-counter and prescription medicines only as told by your health care provider.  If you were prescribed an antibiotic medicine, take it as told by your health care provider. Do not stop taking the antibiotic even if you  start to feel better.  Use a stool softener or a laxative if told to do so by your health care provider. General instructions   Eat a high-fiber diet as told by your health care provider. This can help to prevent constipation.  Drink enough fluid to keep your urine pale yellow.  Take a warm sitz bath for 15-20 minutes, 3-4 times per day, or as told by your health care provider. Sitz baths can ease your pain and discomfort and help with  healing.  Follow good hygiene to keep the anal area as clean and dry as possible. Use wet toilet paper or a moist towelette after each bowel movement.  Keep all follow-up visits as told by your health care provider. This is important. Contact a health care provider if you have:  Increased pain that is not controlled with medicines.  New redness or swelling around the anal area.  New fluid, blood, or pus coming from the anal area.  Tenderness or warmth around the anal area. Get help right away if you have:  A fever.  Severe pain.  Chills or diarrhea.  Severe problems urinating or having a bowel movement. Summary  An anal fistula is a hole that develops between the bowel and the skin near the anus.  This condition is most often caused by a buildup of pus around the anus (anal abscess). Other causes include a complication of surgery, an injury to the rectum, or the use of radiation to treat the rectal area.  This condition is most commonly treated with surgery.  Follow your health care provider's instructions about taking medicines, eating and drinking, or taking sitz baths.  Call your health care provider if you have more pain, swelling, or blood. Get help right away if you have fever, severe pain, or problems passing urine or stool. This information is not intended to replace advice given to you by your health care provider. Make sure you discuss any questions you have with your health care provider. Document Released: 03/31/2008 Document Revised: 09/01/2017 Document Reviewed: 09/01/2017 Elsevier Patient Education  2020 Elsevier Inc.   Losartan tablets What is this medicine? LOSARTAN (loe SAR tan) is used to treat high blood pressure and to reduce the risk of stroke in certain patients. This drug also slows the progression of kidney disease in patients with diabetes. This medicine may be used for other purposes; ask your health care provider or pharmacist if you have  questions. COMMON BRAND NAME(S): Cozaar What should I tell my health care provider before I take this medicine? They need to know if you have any of these conditions:  heart failure  kidney or liver disease  an unusual or allergic reaction to losartan, other medicines, foods, dyes, or preservatives  pregnant or trying to get pregnant  breast-feeding How should I use this medicine? Take this medicine by mouth with a glass of water. Follow the directions on the prescription label. This medicine can be taken with or without food. Take your doses at regular intervals. Do not take your medicine more often than directed. Talk to your pediatrician regarding the use of this medicine in children. Special care may be needed. Overdosage: If you think you have taken too much of this medicine contact a poison control center or emergency room at once. NOTE: This medicine is only for you. Do not share this medicine with others. What if I miss a dose? If you miss a dose, take it as soon as you can. If it  is almost time for your next dose, take only that dose. Do not take double or extra doses. What may interact with this medicine?  blood pressure medicines  diuretics, especially triamterene, spironolactone, or amiloride  fluconazole  NSAIDs, medicines for pain and inflammation, like ibuprofen or naproxen  potassium salts or potassium supplements  rifampin This list may not describe all possible interactions. Give your health care provider a list of all the medicines, herbs, non-prescription drugs, or dietary supplements you use. Also tell them if you smoke, drink alcohol, or use illegal drugs. Some items may interact with your medicine. What should I watch for while using this medicine? Visit your doctor or health care professional for regular checks on your progress. Check your blood pressure as directed. Ask your doctor or health care professional what your blood pressure should be and when you  should contact him or her. Call your doctor or health care professional if you notice an irregular or fast heart beat. Women should inform their doctor if they wish to become pregnant or think they might be pregnant. There is a potential for serious side effects to an unborn child, particularly in the second or third trimester. Talk to your health care professional or pharmacist for more information. You may get drowsy or dizzy. Do not drive, use machinery, or do anything that needs mental alertness until you know how this drug affects you. Do not stand or sit up quickly, especially if you are an older patient. This reduces the risk of dizzy or fainting spells. Alcohol can make you more drowsy and dizzy. Avoid alcoholic drinks. Avoid salt substitutes unless you are told otherwise by your doctor or health care professional. Do not treat yourself for coughs, colds, or pain while you are taking this medicine without asking your doctor or health care professional for advice. Some ingredients may increase your blood pressure. What side effects may I notice from receiving this medicine? Side effects that you should report to your doctor or health care professional as soon as possible:  confusion, dizziness, light headedness or fainting spells  decreased amount of urine passed  difficulty breathing or swallowing, hoarseness, or tightening of the throat  fast or irregular heart beat, palpitations, or chest pain  skin rash, itching  swelling of your face, lips, tongue, hands, or feet Side effects that usually do not require medical attention (report to your doctor or health care professional if they continue or are bothersome):  cough  decreased sexual function or desire  headache  nasal congestion or stuffiness  nausea or stomach pain  sore or cramping muscles This list may not describe all possible side effects. Call your doctor for medical advice about side effects. You may report side  effects to FDA at 1-800-FDA-1088. Where should I keep my medicine? Keep out of the reach of children. Store at room temperature between 15 and 30 degrees C (59 and 86 degrees F). Protect from light. Keep container tightly closed. Throw away any unused medicine after the expiration date. NOTE: This sheet is a summary. It may not cover all possible information. If you have questions about this medicine, talk to your doctor, pharmacist, or health care provider.  2020 Elsevier/Gold Standard (2007-06-29 16:42:18)

## 2018-11-08 NOTE — Progress Notes (Signed)
James Marshall is a 38 y.o. male who presents to [facility] today for 2-week Hx of tearing pain with defecation. Patient reports bright red blood on toilet tissue when wiping. Denies blood in stool, changes in stool, constipation, or changes in bowel movement.  He denies any prior history of hemorrhoids or of rectal fissure    Camren has a history of hypertension.  He does not take any medications for this.  No chest pain palpitation shortness of breath lightheadedness or dizziness.    ROS as above:  Exam:  BP (!) 156/93    Pulse 84    Temp 98.6 F (37 C) (Oral)    Ht 5\' 10"  (1.778 m)    Wt 181 lb (82.1 kg)    SpO2 100%    BMI 25.97 kg/m  Wt Readings from Last 5 Encounters:  11/08/18 181 lb (82.1 kg)  08/14/17 178 lb (80.7 kg)  07/12/17 177 lb (80.3 kg)  05/24/16 170 lb (77.1 kg)  12/07/15 170 lb (77.1 kg)    Gen: Well NAD HEENT: EOMI,  MMM Lungs: Normal work of breathing. CTABL Heart: RRR no MRG Abd: NABS, Soft. Nondistended, Nontender Exts: Brisk capillary refill, warm and well perfused.  Rectal: External anus with small tender erythematous rash extending posterior towards coccix. Otherwise normal appearing external anus with no external hemorrhoids. Digital rectal exam reveals small tender structure at 6 o'clock position of anus. Otherwise normal digital rectal exam with no masses palpated. Normal prostate with no masses or nodules palpated.     Assessment and Plan: 38 y.o. male presents with 2-week Hx of tearing pain with defecation and blood with wiping which is consistent with an anal fissure. External rectal exam revealed small tender erythematous rash on posterior external anus. Digital rectal exam reveals small tender structure at 6 o'clock position of anus consistent with anal fissure. Prescribed 2% Diltiazem gel to be applied topically BID to  increase blood flow and decrease anal sphincter spasming.  Prescribed Miralax 17g daily to soften stools. Recommended warm bathes to assist with circulation and healing.  Recheck if not improving.  Check CBC along with labs.  Prescribed Losartan 50mg  daily for HTN and ordered CMP to check kidney function.  We will also check direct LDL.  Follow-up in 2-3 months.  PDMP not reviewed this encounter. Orders Placed This Encounter  Procedures   CBC   COMPLETE METABOLIC PANEL WITH GFR   LDL cholesterol, direct   Meds ordered this encounter  Medications   diltiazem 2 % GEL    Sig: Apply 1 application topically 2 (two) times daily. Apply topically to rectum twice daily for rectal fissure    Dispense:  30 g    Refill:  12   polyethylene glycol powder (GLYCOLAX/MIRALAX) 17 GM/SCOOP powder    Sig: Take 17 g by mouth daily.    Dispense:  850 g    Refill:  1   losartan (COZAAR) 50 MG tablet    Sig: Take 1 tablet (50 mg total) by mouth daily.    Dispense:  90 tablet    Refill:  0     Historical information moved to improve visibility of documentation.  Past Medical History:  Diagnosis Date   Acute pyelonephritis    GERD (gastroesophageal reflux disease)    Hypertension    MRSA colonization 07/18/2017   Normocytic anemia 7/17   Sepsis ([place]) 7/17   Past Surgical History:  Procedure Laterality Date   CYSTOSCOPY W/ URETERAL STENT PLACEMENT Right 11/07/2015   Procedure: CYSTOSCOPY WITH RETROGRADE AND URETERAL STENT PLACEMENT;  Surgeon: Bjorn PippinJohn Wrenn, MD;  [facility];  Service: Urology;  Laterality: Right;   CYSTOSCOPY/RETROGRADE/URETEROSCOPY/STONE EXTRACTION WITH BASKET Right 11/19/2015   Procedure: CYSTOSCOPY WITH RIGHT URETEROSCOPY, STONE BASKETRY;  Surgeon: Bjorn PippinJohn Wrenn, MD;  Location: [facility] ORS;  Service: Urology;  Laterality: Right;   Social History   Tobacco Use   Smoking status: Former Smoker    Packs/day: 1.00    Years: 10.00    Pack years: 10.00    Types: Cigarettes    Smokeless tobacco: Former NeurosurgeonUser  Substance Use Topics   Alcohol use: Yes    Comment: 2-3 times per week, no history of EtOH withdrawal   family history includes Cervical cancer in his mother; Diabetes in his paternal grandfather; Heart disease in his maternal grandfather; Prostate cancer in his paternal grandfather.  Medications: Current Outpatient Medications  Medication Sig Dispense Refill   doxycycline (VIBRA-TABS) 100 MG tablet Take 1 tablet (100 mg total) by mouth 2 (two) times daily. 20 tablet 1   ibuprofen (ADVIL,MOTRIN) 400 MG tablet Take 1 tablet (400 mg total) by mouth every 8 (eight) hours as needed for fever, headache or moderate pain. 40 tablet 0   naproxen (NAPROSYN) 500 MG tablet Take 1 tablet (500 mg total) by mouth 2 (two) times daily with a meal. 30 tablet 0   diltiazem 2 % GEL Apply 1 application topically 2 (two) times daily. Apply topically to rectum twice daily for rectal fissure 30 g 12   losartan (COZAAR) 50 MG tablet Take 1 tablet (50 mg total) by mouth daily. 90 tablet 0   polyethylene glycol powder (GLYCOLAX/MIRALAX) 17 GM/SCOOP powder Take 17 g by mouth daily. 850 g 1   No current facility-administered medications for this visit.    Allergies  Allergen Reactions   Hydrocodone Nausea And Vomiting     Discussed warning signs or symptoms. Please see discharge instructions. Patient expresses understanding.  I personally was present and performed or re-performed the history, physical exam and medical decision-making activities of this service and have verified that the service and findings are accurately documented in the student's note. ___________________________________________ Clementeen GrahamEvan Kiyoshi Schaab M.D., ABFM., CAQSM. Primary Care and Sports Medicine Adjunct Instructor of Family Medicine  [employer]

## 2019-01-31 ENCOUNTER — Other Ambulatory Visit: Payer: Self-pay | Admitting: Family Medicine

## 2019-02-08 ENCOUNTER — Ambulatory Visit: Payer: BC Managed Care – PPO | Admitting: Family Medicine

## 2019-02-08 NOTE — Progress Notes (Deleted)
       James Marshall is a 38 y.o. male who presents to [facility]: [place] today for ***  Hypertension: Started on lisinopril hydrochlorothiazide about 3 months ago.  Labs were ordered but not completed at that time.   ROS as above:  Exam:  There were no vitals taken for this visit. Wt Readings from Last 5 Encounters:  11/08/18 181 lb (82.1 kg)  08/14/17 178 lb (80.7 kg)  07/12/17 177 lb (80.3 kg)  05/24/16 170 lb (77.1 kg)  12/07/15 170 lb (77.1 kg)    Gen: Well NAD HEENT: EOMI,  MMM Lungs: Normal work of breathing. CTABL Heart: RRR no MRG Abd: NABS, Soft. Nondistended, Nontender Exts: Brisk capillary refill, warm and well perfused.   Lab and Radiology Results No results found for this or any previous visit (from the past 72 hour(s)). No results found.    Assessment and Plan: 38 y.o. male with ***  PDMP not reviewed this encounter. No orders of the defined types were placed in this encounter.  No orders of the defined types were placed in this encounter.    Historical information moved to improve visibility of documentation.  Past Medical History:  Diagnosis Date  . Acute pyelonephritis   . GERD (gastroesophageal reflux disease)   . Hypertension   . 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 Normocytic anemia 7/17  . Sepsis ([place]) 7/17   Past Surgical History:  Procedure Laterality Date  . CYSTOSCOPY W/ URETERAL STENT PLACEMENT Right 11/07/2015   Procedure: CYSTOSCOPY WITH RETROGRADE AND URETERAL STENT PLACEMENT;  Surgeon: Irine Seal, MD;  Location: [facility];  Service: Urology;  Laterality: Right;  . CYSTOSCOPY/RETROGRADE/URETEROSCOPY/STONE EXTRACTION WITH BASKET Right 11/19/2015   Procedure: CYSTOSCOPY WITH RIGHT URETEROSCOPY, STONE BASKETRY;  Surgeon: Irine Seal, MD;  Location: [facility];  Service: Urology;  Laterality: Right;   Social History   Tobacco Use  .  Smoking status: Former Smoker    Packs/day: 1.00    Years: 10.00    Pack years: 10.00    Types: Cigarettes  . Smokeless tobacco: Former Network engineer Use Topics  . Alcohol use: Yes    Comment: 2-3 times per week, no history of EtOH withdrawal   family history includes Cervical cancer in his mother; Diabetes in his paternal grandfather; Heart disease in his maternal grandfather; Prostate cancer in his paternal grandfather.  Medications: Current Outpatient Medications  Medication Sig Dispense Refill  . diltiazem 2 % GEL Apply 1 application topically 2 (two) times daily. Apply topically to rectum twice daily for rectal fissure 30 g 12  . doxycycline (VIBRA-TABS) 100 MG tablet Take 1 tablet (100 mg total) by mouth 2 (two) times daily. 20 tablet 1  . ibuprofen (ADVIL,MOTRIN) 400 MG tablet Take 1 tablet (400 mg total) by mouth every 8 (eight) hours as needed for fever, headache or moderate pain. 40 tablet 0  . losartan (COZAAR) 50 MG tablet TAKE 1 TABLET BY MOUTH EVERY DAY 30 tablet 0  . naproxen (NAPROSYN) 500 MG tablet Take 1 tablet (500 mg total) by mouth 2 (two) times daily with a meal. 30 tablet 0  . polyethylene glycol powder (GLYCOLAX/MIRALAX) 17 GM/SCOOP powder Take 17 g by mouth daily. 850 g 1   No current facility-administered medications for this visit.    Allergies  Allergen Reactions  . Hydrocodone Nausea And Vomiting     Discussed warning signs or symptoms. Please see discharge instructions. Patient expresses understanding.

## 2019-03-08 ENCOUNTER — Other Ambulatory Visit: Payer: Self-pay | Admitting: Family Medicine

## 2019-03-08 NOTE — Telephone Encounter (Signed)
Needs appointment

## 2019-04-08 ENCOUNTER — Other Ambulatory Visit: Payer: Self-pay | Admitting: Osteopathic Medicine

## 2019-04-19 ENCOUNTER — Other Ambulatory Visit: Payer: Self-pay | Admitting: Sports Medicine

## 2019-04-26 ENCOUNTER — Other Ambulatory Visit: Payer: Self-pay | Admitting: Sports Medicine

## 2019-07-03 ENCOUNTER — Other Ambulatory Visit: Payer: Self-pay | Admitting: Sports Medicine

## 2019-12-04 ENCOUNTER — Ambulatory Visit: Payer: BC Managed Care – PPO | Admitting: Family Medicine

## 2019-12-05 ENCOUNTER — Ambulatory Visit (INDEPENDENT_AMBULATORY_CARE_PROVIDER_SITE_OTHER): Payer: BC Managed Care – PPO | Admitting: Medical-Surgical

## 2019-12-05 ENCOUNTER — Other Ambulatory Visit: Payer: Self-pay

## 2019-12-05 ENCOUNTER — Encounter: Payer: Self-pay | Admitting: Medical-Surgical

## 2019-12-05 VITALS — BP 158/114 | HR 86 | Temp 98.4°F | Ht 69.5 in | Wt 179.2 lb

## 2019-12-05 DIAGNOSIS — Z114 Encounter for screening for human immunodeficiency virus [HIV]: Secondary | ICD-10-CM

## 2019-12-05 DIAGNOSIS — Z1159 Encounter for screening for other viral diseases: Secondary | ICD-10-CM

## 2019-12-05 DIAGNOSIS — F1010 Alcohol abuse, uncomplicated: Secondary | ICD-10-CM | POA: Diagnosis not present

## 2019-12-05 DIAGNOSIS — F419 Anxiety disorder, unspecified: Secondary | ICD-10-CM | POA: Diagnosis not present

## 2019-12-05 DIAGNOSIS — I10 Essential (primary) hypertension: Secondary | ICD-10-CM | POA: Diagnosis not present

## 2019-12-05 MED ORDER — LORAZEPAM 1 MG PO TABS
ORAL_TABLET | ORAL | 0 refills | Status: AC
Start: 2019-12-05 — End: 2019-12-27

## 2019-12-05 MED ORDER — ESCITALOPRAM OXALATE 10 MG PO TABS: 10.0000 mg | ORAL_TABLET | Freq: Every day | ORAL | 1 refills | Status: DC

## 2019-12-05 MED ORDER — LOSARTAN POTASSIUM 50 MG PO TABS: 50.0000 mg | ORAL_TABLET | Freq: Every day | ORAL | 1 refills | Status: DC

## 2019-12-05 NOTE — Progress Notes (Signed)
Subjective:    CC: Anxiety, establish care  HPI: Pleasant 39 year old male presenting today to establish care and discuss anxiety. He is overdue for follow up on hypertension.  Anxiety- Has been attending counseling sessions every other week for about 6 weeks. Reports his counselor recommended he get started on medications to help. Describes severe anxiety that is worse at night and when he gets quiet during the day. Anxiety is causing him to be unable to sleep more than 2-3 hours per night, some nights no sleep at all. Notes that his mind races and he has thoughts of bad things happening to himself and his family/friends. Constantly worries over things. Has started drinking at least 2 glasses of Scotch daily to help cope but this is causing significant stress in the home and his relationship with his wife. Is open to starting medication to help reduce his anxiety. Also would like to cut back or stop drinking.  HTN- was previously taking Losartan 50mg  daily with good control of BP. Did not follow up and was unable to get refills. Reports he was unsure if he was being weaned off the medication or not. Has not taken this medication since December 2020. Does not routinely check BP at home but has a cuff available. Denies CP, SOB, lower extremity edema, headaches, and dizziness.  I reviewed the past medical history, family history, social history, surgical history, and allergies today and no changes were needed.  Please see the problem list section below in epic for further details.  Past Medical History: Past Medical History:  Diagnosis Date  . Acute pyelonephritis   . Anxiety   . Depression   . GERD (gastroesophageal reflux disease)   . Hypertension   . 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 Normocytic anemia 7/17  . Sepsis ([facility]) 7/17   Past Surgical History: Past Surgical History:  Procedure Laterality Date  . CYSTOSCOPY W/ URETERAL STENT PLACEMENT Right 11/07/2015   Procedure: CYSTOSCOPY WITH  RETROGRADE AND URETERAL STENT PLACEMENT;  Surgeon: 01/08/2016, MD;  Location: [facility];  Service: Urology;  Laterality: Right;  . CYSTOSCOPY/RETROGRADE/URETEROSCOPY/STONE EXTRACTION WITH BASKET Right 11/19/2015   Procedure: CYSTOSCOPY WITH RIGHT URETEROSCOPY, STONE BASKETRY;  Surgeon: 11/21/2015, MD;  Location: [facility];  Service: Urology;  Laterality: Right;   Social History: Social History   Socioeconomic History  . Marital status: Single    Spouse name: Not on file  . Number of children: Not on file  . Years of education: Not on file  . Highest education level: Not on file  Occupational History  . Not on file  Tobacco Use  . Smoking status: Former Smoker    Packs/day: 1.00    Years: 10.00    Pack years: 10.00    Types: Cigarettes  . Smokeless tobacco: Former Bjorn Pippin  . Vaping Use: Never used  Substance and Sexual Activity  . Alcohol use: Yes    Alcohol/week: 14.0 standard drinks    Types: 14 Standard drinks or equivalent per week    Comment: Pt states that he has at least 1 drink/day but has days where he does have heavy drinking and may drink a pint or more a day  . Drug use: No  . Sexual activity: Yes    Partners: Female    Birth control/protection: Implant  Other Topics Concern  . Not on file  Social History Narrative  . Not on file   Social Determinants of Health   Financial Resource Strain:   .  Difficulty of Paying Living Expenses:   Food Insecurity:   . Worried About Programme researcher, broadcasting/film/video in the Last Year:   . Barista in the Last Year:   Transportation Needs:   . Freight forwarder (Medical):   Marland Kitchen Lack of Transportation (Non-Medical):   Physical Activity:   . Days of Exercise per Week:   . Minutes of Exercise per Session:   Stress:   . Feeling of Stress :   Social Connections:   . Frequency of Communication with Friends and Family:   . Frequency of Social Gatherings with Friends and Family:   . Attends Religious Services:   . Active Member  of Clubs or Organizations:   . Attends Banker Meetings:   Marland Kitchen Marital Status:    Family History: Family History  Problem Relation Age of Onset  . Cervical cancer Mother   . Diabetes Paternal Grandfather   . Prostate cancer Paternal Grandfather   . Heart disease Maternal Grandfather    Allergies: Allergies  Allergen Reactions  . Hydrocodone Nausea And Vomiting   Medications: See med rec.  Review of Systems: See HPI for pertinent positives and negatives.   Objective:    General: Well Developed, well nourished, and in no acute distress.  Neuro: Alert and oriented x3.  HEENT: Normocephalic, atraumatic.  Skin: Warm and dry. Cardiac: Regular rate and rhythm, no murmurs rubs or gallops, no lower extremity edema.  Respiratory: Clear to auscultation bilaterally. Not using accessory muscles, speaking in full sentences.   Impression and Recommendations:    1. Severe anxiety Starting Lexapro 10mg  daily. Starting Ativan taper at night for alcohol cessation. Discussed that this will be a temporary treatment to help prevent alcohol withdrawal and manage severe anxiety at night. Patient verbalized understanding and is agreeable to the plan.  2. Alcohol abuse Discussed alcohol cessation and withdrawal symptoms. Starting Ativan taper from 2mg  nightly x 1 week, 1mg  nightly x 1 week, then 0.5mg  nightly x 1 week, then stop. No drinking while taking Ativan as this can cause excess sedation.  3. Essential hypertension Checking CBC and CMP. Restart Losartan 50mg  daily. Recommend monitoring BP daily at home with goal of 130/80 or less.  - CBC - COMPLETE METABOLIC PANEL WITH GFR  4. Encounter for screening for HIV Discussed screening recommendations. Patient agreeable so adding this to blood work today. - HIV Antibody (routine testing w rflx)  5. Encounter for hepatitis C screening test for low risk patient Discussed screening recommendations. Patient agreeable so adding this to  blood work today. - Hepatitis C antibody  Return in about 4 weeks (around 01/02/2020) for HTN, mood follow up. ___________________________________________ , DNP, APRN, FNP-BC Primary Care and Sports Medicine [facility] [place]

## 2019-12-06 LAB — COMPLETE METABOLIC PANEL WITH GFR
AG Ratio: 1.9 (calc) (ref 1.0–2.5)
ALT: 46 U/L (ref 9–46)
AST: 52 U/L — ABNORMAL HIGH (ref 10–40)
Albumin: 4.9 g/dL (ref 3.6–5.1)
Alkaline phosphatase (APISO): 70 U/L (ref 36–130)
BUN: 12 mg/dL (ref 7–25)
CO2: 29 mmol/L (ref 20–32)
Calcium: 9.7 mg/dL (ref 8.6–10.3)
Chloride: 102 mmol/L (ref 98–110)
Creat: 0.77 mg/dL (ref 0.60–1.35)
GFR, Est African American: 132 mL/min/{1.73_m2} (ref >=60)
GFR, Est Non African American: 114 mL/min/{1.73_m2} (ref >=60)
Globulin: 2.6 g/dL (calc) (ref 1.9–3.7)
Glucose, Bld: 76 mg/dL (ref 65–99)
Potassium: 4.1 mmol/L (ref 3.5–5.3)
Sodium: 142 mmol/L (ref 135–146)
Total Bilirubin: 0.6 mg/dL (ref 0.2–1.2)
Total Protein: 7.5 g/dL (ref 6.1–8.1)

## 2019-12-06 LAB — HIV ANTIBODY (ROUTINE TESTING W REFLEX): HIV 1&2 Ab, 4th Generation: NONREACTIVE

## 2019-12-06 LAB — CBC
HCT: 44.8 % (ref 38.5–50.0)
Hemoglobin: 15.6 g/dL (ref 13.2–17.1)
MCH: 32.6 pg (ref 27.0–33.0)
MCHC: 34.8 g/dL (ref 32.0–36.0)
MCV: 93.7 fL (ref 80.0–100.0)
MPV: 10 fL (ref 7.5–12.5)
Platelets: 295 10*3/uL (ref 140–400)
RBC: 4.78 10*6/uL (ref 4.20–5.80)
RDW: 12.6 % (ref 11.0–15.0)
WBC: 5.3 10*3/uL (ref 3.8–10.8)

## 2019-12-06 LAB — HEPATITIS C ANTIBODY
Hepatitis C Ab: NONREACTIVE
SIGNAL TO CUT-OFF: 0.02 (ref <1.00)

## 2019-12-28 ENCOUNTER — Other Ambulatory Visit: Payer: Self-pay | Admitting: Medical-Surgical

## 2020-01-02 ENCOUNTER — Ambulatory Visit (INDEPENDENT_AMBULATORY_CARE_PROVIDER_SITE_OTHER): Payer: BC Managed Care – PPO | Admitting: Medical-Surgical

## 2020-01-02 ENCOUNTER — Encounter: Payer: Self-pay | Admitting: Medical-Surgical

## 2020-01-02 VITALS — BP 129/84 | HR 68 | Temp 98.2°F | Ht 69.5 in | Wt 178.0 lb

## 2020-01-02 DIAGNOSIS — F1010 Alcohol abuse, uncomplicated: Secondary | ICD-10-CM

## 2020-01-02 DIAGNOSIS — F419 Anxiety disorder, unspecified: Secondary | ICD-10-CM

## 2020-01-02 DIAGNOSIS — Z23 Encounter for immunization: Secondary | ICD-10-CM | POA: Diagnosis not present

## 2020-01-02 DIAGNOSIS — I10 Essential (primary) hypertension: Secondary | ICD-10-CM | POA: Diagnosis not present

## 2020-01-02 MED ORDER — ESCITALOPRAM OXALATE 10 MG PO TABS: 10.0000 mg | ORAL_TABLET | Freq: Every day | ORAL | 1 refills | Status: DC

## 2020-01-02 MED ORDER — LOSARTAN POTASSIUM 50 MG PO TABS: 50.0000 mg | ORAL_TABLET | Freq: Every day | ORAL | 1 refills | Status: DC

## 2020-01-02 NOTE — Progress Notes (Signed)
Subjective:    CC: Hypertension and anxiety follow-up  HPI: Pleasant 39 year old male presenting today for follow-up of hypertension and anxiety.  Hypertension-restarted losartan 50 mg daily on 12/05/2019.  Has been taking his dose daily, tolerating well without side effects.  Checking his blood pressure at home and reports for the last week his readings have been at goal.  He has also stopped drinking alcohol.  Denies chest pain, shortness of breath, headaches, dizziness, palpitations, and lower extremity edema.  Anxiety-was started on Lexapro on 12/05/2019.  He has been taking his dose daily, tolerating well without side effects.  Had some nausea during the first 2 weeks but this was reduced by taking his medication with food.  Today, he reports he feels great, much better than he was a month ago.  Notes that he did not notice much of an effect over the first 2 to 3 weeks but this last week has been very good.  His wife told him she noticed a change since he started taking the medication.  He is still doing counseling as directed.  Previously he had used alcohol intake to help with anxiety in the evenings and sleeping.  We did do a brief course of Ativan at night to help him with alcohol cessation.  He has completed the taper.  Notes the last couple of nights he has had a bit of difficulty falling asleep but he has started taking melatonin.  Denies SI/HI.  I reviewed the past medical history, family history, social history, surgical history, and allergies today and no changes were needed.  Please see the problem list section below in epic for further details.  Past Medical History: Past Medical History:  Diagnosis Date  . Acute pyelonephritis   . Anxiety   . Depression   . GERD (gastroesophageal reflux disease)   . Hypertension   . 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 Normocytic anemia 7/17  . Sepsis ([facility]) 7/17   Past Surgical History: Past Surgical History:  Procedure Laterality Date  .  CYSTOSCOPY W/ URETERAL STENT PLACEMENT Right 11/07/2015   Procedure: CYSTOSCOPY WITH RETROGRADE AND URETERAL STENT PLACEMENT;  Surgeon: Bjorn Pippin, MD;  Location: [facility] OR;  Service: Urology;  Laterality: Right;  . CYSTOSCOPY/RETROGRADE/URETEROSCOPY/STONE EXTRACTION WITH BASKET Right 11/19/2015   Procedure: CYSTOSCOPY WITH RIGHT URETEROSCOPY, STONE BASKETRY;  Surgeon: Bjorn Pippin, MD;  Location: [facility];  Service: Urology;  Laterality: Right;   Social History: Social History   Socioeconomic History  . Marital status: Married    Spouse name: Not on file  . Number of children: Not on file  . Years of education: Not on file  . Highest education level: Not on file  Occupational History  . Not on file  Tobacco Use  . Smoking status: Former Smoker    Packs/day: 1.00    Years: 10.00    Pack years: 10.00    Types: Cigarettes  . Smokeless tobacco: Former Clinical biochemist  . Vaping Use: Never used  Substance and Sexual Activity  . Alcohol use: Yes    Alcohol/week: 14.0 standard drinks    Types: 14 Standard drinks or equivalent per week    Comment: Pt states that he has at least 1 drink/day but has days where he does have heavy drinking and may drink a pint or more a day  . Drug use: No  . Sexual activity: Yes    Partners: Female    Birth control/protection: Implant  Other Topics Concern  . Not on file  Social History Narrative  . Not on file   Social Determinants of Health   Financial Resource Strain:   . Difficulty of Paying Living Expenses: Not on file  Food Insecurity:   . Worried About Programme researcher, broadcasting/film/video in the Last Year: Not on file  . Ran Out of Food in the Last Year: Not on file  Transportation Needs:   . Lack of Transportation (Medical): Not on file  . Lack of Transportation (Non-Medical): Not on file  Physical Activity:   . Days of Exercise per Week: Not on file  . Minutes of Exercise per Session: Not on file  Stress:   . Feeling of Stress : Not on file  Social  Connections:   . Frequency of Communication with Friends and Family: Not on file  . Frequency of Social Gatherings with Friends and Family: Not on file  . Attends Religious Services: Not on file  . Active Member of Clubs or Organizations: Not on file  . Attends Banker Meetings: Not on file  . Marital Status: Not on file   Family History: Family History  Problem Relation Age of Onset  . Cervical cancer Mother   . Diabetes Paternal Grandfather   . Prostate cancer Paternal Grandfather   . Heart disease Maternal Grandfather    Allergies: Allergies  Allergen Reactions  . Hydrocodone Nausea And Vomiting   Medications: See med rec.  Review of Systems: See HPI for pertinent positives and negatives.   Depression screen [facility] 2/9 01/02/2020 12/05/2019 11/08/2018 07/12/2017 12/07/2015  Decreased Interest 0 1 0 0 0  Down, Depressed, Hopeless 0 2 0 0 0  PHQ - 2 Score 0 3 0 0 0  Altered sleeping 1 2 0 - -  Tired, decreased energy 1 1 0 - -  Change in appetite 0 1 0 - -  Feeling bad or failure about yourself  0 2 0 - -  Trouble concentrating 1 1 0 - -  Moving slowly or fidgety/restless 0 0 0 - -  Suicidal thoughts 0 0 0 - -  PHQ-9 Score 3 10 0 - -  Difficult doing work/chores Not difficult at all Somewhat difficult Not difficult at all - -   GAD 7 : Generalized Anxiety Score 01/02/2020 12/05/2019 11/08/2018  Nervous, Anxious, on Edge 1 3 0  Control/stop worrying 1 3 0  Worry too much - different things 1 3 0  Trouble relaxing 1 3 1   Restless 0 3 0  Easily annoyed or irritable 0 3 0  Afraid - awful might happen 0 3 0  Total GAD 7 Score 4 21 1   Anxiety Difficulty Not difficult at all Very difficult Somewhat difficult      Objective:    General: Well Developed, well nourished, and in no acute distress.  Neuro: Alert and oriented x3.  HEENT: Normocephalic, atraumatic.  Skin: Warm and dry. Cardiac: Regular rate and rhythm, no murmurs rubs or gallops, no lower extremity edema.   Respiratory: Clear to auscultation bilaterally. Not using accessory muscles, speaking in full sentences.   Impression and Recommendations:    1. Essential hypertension Continue losartan 50 mg daily.  Continue to avoid alcohol and added dietary sodium.  Monitor blood pressures at home a few times a week with a goal of 130/80 or less.  2. Severe anxiety Doing very well on Lexapro.  Continue Lexapro 10 mg daily.  Continue counseling as instructed.  3. Alcohol abuse No alcohol intake for 4 weeks.  Continue to avoid alcohol.  Discussed that this will not be an easy journey but he is making great progress.  If needed, consider AA or other support available to the community for alcohol abuse.  4. Need for influenza vaccination flu vaccine given in office by MA. - Flu Vaccine QUAD 36+ mos IM  Return in about 6 months (around 07/01/2020) for mood and HTN follow up. ___________________________________________ Thayer Ohm, DNP, APRN, FNP-BC Primary Care and Sports Medicine [facility] [place]

## 2020-01-02 NOTE — Patient Instructions (Signed)

## 2020-06-30 NOTE — Progress Notes (Signed)
   Complete physical exam  Patient: Morgan A Kirkman   DOB: 02/19/2000   40 y.o. Male  MRN: [id number]  Subjective:    No chief complaint on file.   Morgan A Kirkman is a 40 y.o. male who presents today for a complete physical exam. She reports consuming a {diet types:17450} diet. {types:19826} She generally feels {DESC; WELL/FAIRLY WELL/POORLY:18703}. She reports sleeping {DESC; WELL/FAIRLY WELL/POORLY:18703}. She {does/does not:200015} have additional problems to discuss today.    Most recent fall risk assessment:    10/27/2021   10:42 AM  Fall Risk   Falls in the past year? 0  Number falls in past yr: 0  Injury with Fall? 0  Risk for fall due to : No Fall Risks  Follow up Falls evaluation completed     Most recent depression screenings:    10/27/2021   10:42 AM 09/17/2020   10:46 AM  PHQ 2/9 Scores  PHQ - 2 Score 0 0  PHQ- 9 Score 5     {VISON DENTAL STD PSA (Optional):27386}  {History (Optional):23778}  Patient Care Team: Judit Awad, NP as PCP - General (Nurse Practitioner)   Outpatient Medications Prior to Visit  Medication Sig   fluticasone (FLONASE) 50 MCG/ACT nasal spray Place 2 sprays into both nostrils in the morning and at bedtime. After 7 days, reduce to once daily.   norgestimate-ethinyl estradiol (SPRINTEC 28) 0.25-35 MG-MCG tablet Take 1 tablet by mouth daily.   Nystatin POWD Apply liberally to affected area 2 times per day   spironolactone (ALDACTONE) 100 MG tablet Take 1 tablet (100 mg total) by mouth daily.   No facility-administered medications prior to visit.    ROS        Objective:     There were no vitals taken for this visit. {Vitals History (Optional):23777}  Physical Exam   No results found for any visits on 12/02/21. {Show previous labs (optional):23779}    Assessment & Plan:    Routine Health Maintenance and Physical Exam  Immunization History  Administered Date(s) Administered   DTaP 05/05/1999, 07/01/1999,  09/09/1999, 05/25/2000, 12/09/2003   Hepatitis A 10/05/2007, 10/10/2008   Hepatitis B 02/20/1999, 03/30/1999, 09/09/1999   HiB (PRP-OMP) 05/05/1999, 07/01/1999, 09/09/1999, 05/25/2000   IPV 05/05/1999, 07/01/1999, 02/28/2000, 12/09/2003   Influenza,inj,Quad PF,6+ Mos 01/10/2014   Influenza-Unspecified 04/11/2012   MMR 02/27/2001, 12/09/2003   Meningococcal Polysaccharide 10/10/2011   Pneumococcal Conjugate-13 05/25/2000   Pneumococcal-Unspecified 09/09/1999, 11/23/1999   Tdap 10/10/2011   Varicella 02/28/2000, 10/05/2007    Health Maintenance  Topic Date Due   HIV Screening  Never done   Hepatitis C Screening  Never done   INFLUENZA VACCINE  11/30/2021   PAP-Cervical Cytology Screening  12/02/2021 (Originally 02/19/2020)   PAP SMEAR-Modifier  12/02/2021 (Originally 02/19/2020)   TETANUS/TDAP  12/02/2021 (Originally 10/09/2021)   HPV VACCINES  Discontinued   COVID-19 Vaccine  Discontinued    Discussed health benefits of physical activity, and encouraged her to engage in regular exercise appropriate for her age and condition.  Problem List Items Addressed This Visit   None Visit Diagnoses     Annual physical exam    -  Primary   Cervical cancer screening       Need for Tdap vaccination          No follow-ups on file.     Vika Buske, NP   

## 2020-07-01 ENCOUNTER — Ambulatory Visit (INDEPENDENT_AMBULATORY_CARE_PROVIDER_SITE_OTHER): Payer: BC Managed Care – PPO | Admitting: Medical-Surgical

## 2020-07-01 DIAGNOSIS — Z5329 Procedure and treatment not carried out because of patient's decision for other reasons: Secondary | ICD-10-CM

## 2020-07-01 DIAGNOSIS — I10 Essential (primary) hypertension: Secondary | ICD-10-CM

## 2020-07-01 DIAGNOSIS — F419 Anxiety disorder, unspecified: Secondary | ICD-10-CM

## 2020-07-01 DIAGNOSIS — F1010 Alcohol abuse, uncomplicated: Secondary | ICD-10-CM

## 2020-08-30 ENCOUNTER — Other Ambulatory Visit: Payer: Self-pay | Admitting: Medical-Surgical

## 2020-09-14 ENCOUNTER — Other Ambulatory Visit: Payer: Self-pay | Admitting: Medical-Surgical

## 2020-09-28 ENCOUNTER — Other Ambulatory Visit: Payer: Self-pay | Admitting: Medical-Surgical

## 2020-11-24 ENCOUNTER — Encounter: Payer: Self-pay | Admitting: Medical-Surgical

## 2020-11-24 DIAGNOSIS — F1010 Alcohol abuse, uncomplicated: Secondary | ICD-10-CM

## 2020-11-24 NOTE — Progress Notes (Signed)
Erroneous encounter

## 2020-12-16 ENCOUNTER — Other Ambulatory Visit: Payer: Self-pay | Admitting: Medical-Surgical

## 2020-12-31 ENCOUNTER — Other Ambulatory Visit: Payer: Self-pay | Admitting: Medical-Surgical

## 2021-02-25 ENCOUNTER — Other Ambulatory Visit: Payer: Self-pay

## 2021-02-25 ENCOUNTER — Emergency Department (HOSPITAL_COMMUNITY)
Admission: EM | Admit: 2021-02-25 | Discharge: 2021-02-26 | Disposition: A | Discharge status: Home / Self Care | Payer: BC Managed Care – PPO | Attending: Emergency Medicine | Admitting: Emergency Medicine

## 2021-02-25 ENCOUNTER — Encounter (HOSPITAL_COMMUNITY): Payer: Self-pay | Admitting: Emergency Medicine

## 2021-02-25 ENCOUNTER — Emergency Department (HOSPITAL_COMMUNITY): Payer: BC Managed Care – PPO

## 2021-02-25 DIAGNOSIS — E871 Hypo-osmolality and hyponatremia: Secondary | ICD-10-CM | POA: Diagnosis not present

## 2021-02-25 DIAGNOSIS — Z79899 Other long term (current) drug therapy: Secondary | ICD-10-CM | POA: Diagnosis not present

## 2021-02-25 DIAGNOSIS — Z87891 Personal history of nicotine dependence: Secondary | ICD-10-CM | POA: Insufficient documentation

## 2021-02-25 DIAGNOSIS — R739 Hyperglycemia, unspecified: Secondary | ICD-10-CM | POA: Diagnosis not present

## 2021-02-25 DIAGNOSIS — Y900 Blood alcohol level of less than 20 mg/100 ml: Secondary | ICD-10-CM | POA: Insufficient documentation

## 2021-02-25 DIAGNOSIS — R569 Unspecified convulsions: Secondary | ICD-10-CM | POA: Insufficient documentation

## 2021-02-25 DIAGNOSIS — I10 Essential (primary) hypertension: Secondary | ICD-10-CM | POA: Insufficient documentation

## 2021-02-25 LAB — COMPREHENSIVE METABOLIC PANEL
ALT: 35 U/L (ref 0–44)
AST: 46 U/L — ABNORMAL HIGH (ref 15–41)
Albumin: 4.6 g/dL (ref 3.5–5.0)
Alkaline Phosphatase: 52 U/L (ref 38–126)
Anion gap: 11 (ref 5–15)
BUN: 10 mg/dL (ref 6–20)
CO2: 24 mmol/L (ref 22–32)
Calcium: 9.6 mg/dL (ref 8.9–10.3)
Chloride: 99 mmol/L (ref 98–111)
Creatinine, Ser: 0.79 mg/dL (ref 0.61–1.24)
GFR, Estimated: >60 mL/min (ref >60)
Glucose, Bld: 107 mg/dL — ABNORMAL HIGH (ref 70–99)
Potassium: 3.7 mmol/L (ref 3.5–5.1)
Sodium: 134 mmol/L — ABNORMAL LOW (ref 135–145)
Total Bilirubin: 1.1 mg/dL (ref 0.3–1.2)
Total Protein: 7.5 g/dL (ref 6.5–8.1)

## 2021-02-25 LAB — CBC WITH DIFFERENTIAL/PLATELET
Abs Immature Granulocytes: 0.03 10*3/uL (ref 0.00–0.07)
Basophils Absolute: 0 10*3/uL (ref 0.0–0.1)
Basophils Relative: 0 %
Eosinophils Absolute: 0.1 10*3/uL (ref 0.0–0.5)
Eosinophils Relative: 1 %
HCT: 43.3 % (ref 39.0–52.0)
Hemoglobin: 15 g/dL (ref 13.0–17.0)
Immature Granulocytes: 0 %
Lymphocytes Relative: 15 %
Lymphs Abs: 1.2 10*3/uL (ref 0.7–4.0)
MCH: 32.1 pg (ref 26.0–34.0)
MCHC: 34.6 g/dL (ref 30.0–36.0)
MCV: 92.5 fL (ref 80.0–100.0)
Monocytes Absolute: 0.7 10*3/uL (ref 0.1–1.0)
Monocytes Relative: 8 %
Neutro Abs: 6 10*3/uL (ref 1.7–7.7)
Neutrophils Relative %: 76 %
Platelets: 248 10*3/uL (ref 150–400)
RBC: 4.68 MIL/uL (ref 4.22–5.81)
RDW: 12.7 % (ref 11.5–15.5)
WBC: 8 10*3/uL (ref 4.0–10.5)
nRBC: 0 % (ref 0.0–0.2)

## 2021-02-25 NOTE — ED Triage Notes (Signed)
Patient BIB [facility] from work after witnessed seizure lasting approximately 3 minutes. Alert, oriented, and in no apparent distress at this time. 18g in left AC, received 4mg  zofran  HR 120 BP 160/110 CBG 124

## 2021-02-25 NOTE — ED Provider Notes (Signed)
Emergency Medicine Provider Triage Evaluation Note  James Marshall , Marshall 40 y.o. male  was evaluated in triage.  Pt complains of seizure like activity. Lasted approx 3 min. Injury to left tongue. No recent medication changes, EtOH use, illicit substance use.  No urinary incontinence.  No prior seizure activity.  Patient states prior to seizure he felt "off" however no numbness, weakness, facial droop. Unsure last tetanus (in Epic 2019)  Review of Systems  Positive: Seizure-like activity, tongue injury Negative: Headache, numbness, weakness  Physical Exam  BP (!) 151/103 (BP Location: Right Arm)   Pulse (!) 114   Temp 98.5 F (36.9 C) (Oral)   Resp 18   SpO2 97%  Gen:   Awake, no distress   Neck:  C-collar Mouth:  Laceration to right side of tongue Resp:  Normal effort  MSK:   Moves extremities without difficulty  Neuro:  CN 2-12 intact, equal strength, intact sensation Other:    Medical Decision Making  Medically screening exam initiated at 7:57 PM.  Appropriate orders placed.  James Marshall was informed that the remainder of the evaluation will be completed by another provider, this initial triage assessment does not replace that evaluation, and the importance of remaining in the ED until their evaluation is complete.  Seizure-like activity, tongue injury  No focal neurologic deficits on exam   James Barefoot A, PA-C 02/25/21 2004    Rolan Bucco, MD 02/25/21 [phone]

## 2021-02-26 ENCOUNTER — Emergency Department (HOSPITAL_COMMUNITY): Payer: BC Managed Care – PPO

## 2021-02-26 LAB — URINALYSIS, ROUTINE W REFLEX MICROSCOPIC
Bilirubin Urine: NEGATIVE
Glucose, UA: NEGATIVE mg/dL
Hgb urine dipstick: NEGATIVE
Ketones, ur: 20 mg/dL — AB
Leukocytes,Ua: NEGATIVE
Nitrite: NEGATIVE
Protein, ur: NEGATIVE mg/dL
Specific Gravity, Urine: 1.023 (ref 1.005–1.030)
pH: 6 (ref 5.0–8.0)

## 2021-02-26 LAB — RAPID URINE DRUG SCREEN, HOSP PERFORMED
Amphetamines: NOT DETECTED
Barbiturates: NOT DETECTED
Benzodiazepines: NOT DETECTED
Cocaine: NOT DETECTED
Opiates: NOT DETECTED
Tetrahydrocannabinol: NOT DETECTED

## 2021-02-26 LAB — ETHANOL: Alcohol, Ethyl (B): <10 mg/dL (ref <10)

## 2021-02-26 MED ORDER — GADOBUTROL 1 MMOL/ML IV SOLN
8.0000 mL | Freq: Once | INTRAVENOUS | Status: AC | PRN
  Administered 2021-02-26: 8 mL via INTRAVENOUS

## 2021-02-26 NOTE — ED Notes (Signed)
Pt transported to MRI 

## 2021-02-26 NOTE — ED Provider Notes (Signed)
[facility] EMERGENCY DEPARTMENT Provider Note   CSN: [id number] Arrival date & time: 02/25/21  1853     History Chief Complaint  Patient presents with   Seizures    James Marshall is a 40 y.o. male with a past medical history significant for anxiety, depression, and hypertension who presents to the ED due to seizure-like activity. 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  You minutes later, patient had a witnessed tonic-clonic seizure that lasted roughly 3 minutes.  No history of seizure disorder.  Patient admits to drinking roughly two 6 ounce glasses of liquor nightly.  Last alcoholic beverage was the night previous.  No urinary incontinence.  Patient admits to left tongue trauma. Denies drug use. He is prescribed ativan, but notes he has not used it in awhile.  Patient admits to feeling tired with myalgias.  Denies changes to speech, visual changes, and unilateral weakness.  No dizziness.  Denies chest pain and shortness of breath.  Patient was at his normal state of health prior to the seizure. Patient given zofran by EMS prior to arrival.  History obtained from patient, wife at bedside, and past medical records. No interpreter used during encounter.         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   Left flank pain 05/24/2016   Hypokalemia    Essential hypertension    Normocytic anemia 11/08/2015   Right ureteral stone 11/07/2015    Past Surgical History:  Procedure Laterality Date   CYSTOSCOPY W/ URETERAL STENT PLACEMENT Right 11/07/2015   Procedure: CYSTOSCOPY WITH RETROGRADE AND URETERAL  STENT PLACEMENT;  Surgeon: Bjorn Pippin, MD;  Location: [facility] OR;  Service: Urology;  Laterality: Right;   CYSTOSCOPY/RETROGRADE/URETEROSCOPY/STONE EXTRACTION WITH BASKET Right 11/19/2015   Procedure: CYSTOSCOPY WITH RIGHT URETEROSCOPY, STONE BASKETRY;  Surgeon: Bjorn Pippin, MD;  Location: [facility] ORS;  Service: Urology;  Laterality: Right;       Family History  Problem Relation Age of Onset   Cervical cancer Mother    Diabetes Paternal Grandfather    Prostate cancer Paternal Grandfather    Heart disease Maternal Grandfather     Social History   Tobacco Use   Smoking status: Former    Packs/day: 1.00    Years: 10.00    Pack years: 10.00    Types: Cigarettes   Smokeless tobacco: Former  Building services engineer Use: Never used  Substance Use Topics   Alcohol use: Yes    Alcohol/week: 14.0 standard drinks    Types: 14 Standard drinks or equivalent per week    Comment: Pt states that he has at least 1 drink/day but has days where he does have heavy drinking and may drink a pint or more a day   Drug use: No    Home Medications Prior to Admission medications   Medication Sig Start Date End Date Taking? Authorizing Provider  escitalopram (LEXAPRO) 10 MG tablet TAKE 1 TABLET (10 MG TOTAL) BY MOUTH DAILY. NEEDS APPT FOR FURTHER REFILLS Patient taking differently: Take 5 mg by mouth daily as needed (anxiety). NEEDS  APPT FOR FURTHER REFILLS 12/16/20  Yes Christen Butter, NP  losartan (COZAAR) 50 MG tablet Take 1 tablet (50 mg total) by mouth daily. 01/02/20  Yes Christen Butter, NP    Allergies    Hydrocodone  Review of Systems   Review of Systems  Constitutional:  Positive for fatigue. Negative for fever.  Eyes:  Negative for visual disturbance.  Respiratory:  Negative for shortness of breath.   Cardiovascular:  Negative for chest pain.  Gastrointestinal:  Negative for abdominal pain.  Musculoskeletal:  Positive for myalgias.  Neurological:  Positive for seizures. Negative for speech difficulty,  weakness and headaches.  All other systems reviewed and are negative.  Physical Exam Updated Vital Signs BP (!) 144/107 (BP Location: Right Arm)   Pulse 83   Temp 98.3 F (36.8 C) (Oral)   Resp 16   SpO2 98%   Physical Exam Vitals and nursing note reviewed.  Constitutional:      General: He is not in acute distress.    Appearance: He is not ill-appearing.  HENT:     Head: Normocephalic.  Eyes:     Pupils: Pupils are equal, round, and reactive to light.  Neck:     Comments: No cervical midline tenderness Cardiovascular:     Rate and Rhythm: Normal rate and regular rhythm.     Pulses: Normal pulses.     Heart sounds: Normal heart sounds. No murmur heard.   No friction rub. No gallop.  Pulmonary:     Effort: Pulmonary effort is normal.     Breath sounds: Normal breath sounds.  Abdominal:     General: Abdomen is flat. There is no distension.     Palpations: Abdomen is soft.     Tenderness: There is no abdominal tenderness. There is no guarding or rebound.  Musculoskeletal:        General: Normal range of motion.     Cervical back: Neck supple.     Comments: No thoracic or lumbar midline tenderness  Skin:    General: Skin is warm and dry.  Neurological:     General: No focal deficit present.     Mental Status: He is alert.     Comments: Speech is clear, able to follow commands CN III-XII intact Normal strength in upper and lower extremities bilaterally including dorsiflexion and plantar flexion, strong and equal grip strength Sensation grossly intact throughout Moves extremities without ataxia, coordination intact No pronator drift Ambulates without difficulty  Psychiatric:        Mood and Affect: Mood normal.        Behavior: Behavior normal.    ED Results / Procedures / Treatments   Labs (all labs ordered are listed, but only abnormal results are displayed) Labs Reviewed  COMPREHENSIVE METABOLIC PANEL - Abnormal; Notable for the following components:       Result Value   Sodium 134 (*)    Glucose, Bld 107 (*)    AST 46 (*)    All other components within normal limits  URINALYSIS, ROUTINE W REFLEX MICROSCOPIC - Abnormal; Notable for the following components:   Color, Urine AMBER (*)    APPearance HAZY (*)    Ketones, ur 20 (*)    All other components within normal limits  CBC WITH DIFFERENTIAL/PLATELET  RAPID URINE DRUG SCREEN, HOSP PERFORMED  ETHANOL    EKG None  Radiology CT HEAD WO CONTRAST ( )  Result Date: 02/25/2021 CLINICAL DATA:  Status post seizure. EXAM: CT HEAD WITHOUT CONTRAST TECHNIQUE:  Contiguous axial images were obtained from the base of the skull through the vertex without intravenous contrast. COMPARISON:  None. FINDINGS: Brain: No evidence of acute infarction, hemorrhage, hydrocephalus, extra-axial collection or mass lesion/mass effect. A small chronic right external capsule lacunar infarct is suspected (axial CT image 14, CT series 3). Vascular: No hyperdense vessel or unexpected calcification. Skull: Normal. Negative for fracture or focal lesion. Sinuses/Orbits: No acute finding. Other: None. IMPRESSION: No acute intracranial pathology. Electronically Signed   By: Aram Candela M.D.   On: 02/25/2021 21:06   CT Cervical Spine Wo Contrast  Result Date: 02/25/2021 CLINICAL DATA:  Status post seizure. EXAM: CT CERVICAL SPINE WITHOUT CONTRAST TECHNIQUE: Multidetector CT imaging of the cervical spine was performed without intravenous contrast. Multiplanar CT image reconstructions were also generated. COMPARISON:  None. FINDINGS: Alignment: Normal. Skull base and vertebrae: No acute fracture. No primary bone lesion or focal pathologic process. Soft tissues and spinal canal: No prevertebral fluid or swelling. No visible canal hematoma. Disc levels: Mild endplate sclerosis and anterior osteophyte formation are seen at the levels of C3-C4, C4-C5 and C5-C6. Mild intervertebral disc space narrowing is seen at C2-C3. Mild,  bilateral multilevel facet joint hypertrophy is noted. Upper chest: Negative. Other: None. IMPRESSION: 1. No acute fracture or subluxation of the cervical spine. 2. Mild degenerative changes, as described above. Electronically Signed   By: Aram Candela M.D.   On: 02/25/2021 21:09   MR Brain W and Wo Contrast  Result Date: 02/26/2021 CLINICAL DATA:  Seizure, abnormal neuro exam EXAM: MRI HEAD WITHOUT AND WITH CONTRAST TECHNIQUE: Multiplanar, multiecho pulse sequences of the brain and surrounding structures were obtained without and with intravenous contrast. CONTRAST:  32mL GADAVIST GADOBUTROL 1 MMOL/ML IV SOLN COMPARISON:  CT head February 25, 2021. FINDINGS: Brain: No acute infarction, hemorrhage, hydrocephalus, extra-axial collection or mass lesion. Mild scattered T2 hyperintensities in the white matter, nonspecific but compatible with chronic microvascular ischemic disease. The hippocampi are symmetric in size/signal and within normal limits. No abnormal enhancement. Vascular: Major arterial flow voids are maintained at the skull base. Skull and upper cervical spine: Normal marrow signal. Sinuses/Orbits: Mild paranasal sinus mucosal thickening with retention cysts in bilateral maxillary sinuses. Unremarkable orbits. Other: No sizable mastoid effusions. IMPRESSION: 1. No evidence of acute intracranial abnormality. 2. Mild chronic microvascular ischemic disease. Electronically Signed   By: Feliberto Harts M.D.   On: 02/26/2021 09:42    Procedures Procedures   Medications Ordered in ED Medications  gadobutrol (GADAVIST) 1 MMOL/ML injection 8 mL (8 mLs Intravenous Contrast Given 02/26/21 0916)    ED Course  I have reviewed the triage vital signs and the nursing notes.  Pertinent labs & imaging results that were available during my care of the patient were reviewed by me and considered in my medical decision making (see chart for details).    MDM Rules/Calculators/A&P                            40 year old male presents to the ED due to seizure-like activity.  Patient had a witnessed tonic-clonic seizure that lasted roughly 3 minutes.  Seizure was preceded by difficulty speaking.  No history of seizure disorder.  Patient admits to drinking 2 6 oz glasses of liquor nightly. Last alcoholic drink was the night before.  Upon arrival, patient afebrile and tachycardic at 114 however, tachycardia resolved during my initial evaluation.  Patient in no acute distress.  Patient not postictal at bedside.  Unfortunately, patient waited over 12 hours prior to my initial evaluation due to long wait times.  No seizure activity while in the waiting room for over 12 hours.  Given vitals have remained normal during his 12-hour wait, low suspicion for alcohol withdrawal.  Patient no longer takes Ativan so low suspicion for benzodiazepine withdrawal.  Routine labs and CT head/cervical spine ordered at triage.  CBC unremarkable.  No leukocytosis and normal hemoglobin.  CMP significant for mild hyponatremia 134 and hyperglycemia 107.  No anion gap.  AST at 46.  Normal renal function.  CT head and cervical spine personally reviewed which is negative for any acute abnormalities. UA negative for infection. UDS negative.  7:58 AM Discussed with Cammie Mcgee with neurology who recommends MRI brain. If MRI brain negative, patient can follow-up with neurology in outpatient setting.  MRI negative for acute abnormalities.  Ambulatory referral placed for neurology.  Advised patient not to drive or operate heavy machinery until evaluated by neurologist. Seizure precautions discussed with patient and wife at bedside. Strict ED precautions discussed with patient. Patient states understanding and agrees to plan. Patient discharged home in no acute distress and stable vitals   Final Clinical Impression(s) / ED Diagnoses Final diagnoses:  Seizure-like activity (HCC)    Rx / DC Orders ED Discharge Orders          Ordered     Ambulatory referral to Neurology       Comments: An appointment is requested in approximately: 1 week   02/26/21 0736             Mannie Stabile, PA-C 02/26/21 1014    Pricilla Loveless, MD 02/26/21 1534

## 2021-02-26 NOTE — ED Notes (Signed)
Pt reports seizure-like activity while at school event witnessed by a parent who is a nurse per pt. Pt reports slurring and out of body experience and was told that he had a violent seizure and foaming at the mouth and had confusion after. Pt's significant other also reports that they were told pt hit his head on a metal AC unit. Pt endorses 1/2 cup x2 alcohol use nightly and that he hadn't had any the night prior to incident.

## 2021-02-26 NOTE — ED Notes (Signed)
Reviewed discharge instructions with patient and spouse. Follow-up care reviewed. Patient and spouse verbalized understanding. Patient A&Ox4, VSS, and ambulatory with steady gait upon discharge. 

## 2021-02-26 NOTE — Discharge Instructions (Addendum)
It was a pleasure taking care of you today.  As discussed, you cannot drive or operate heavy machinery until evaluated by neurology.  All of your labs and CT scans were reassuring.  MRI was normal as well. Please follow-up with neurology within the next week for further evaluation.  Return to the ER for new or worsening symptoms.

## 2021-03-01 ENCOUNTER — Telehealth: Payer: Self-pay | Admitting: General Practice

## 2021-03-01 NOTE — Telephone Encounter (Signed)
Transition Care Management Unsuccessful Follow-up Telephone Call  Date of discharge and from where:  02/26/21 from [facility]  Attempts:  1st Attempt  Reason for unsuccessful TCM follow-up call:  Left voice message

## 2021-03-03 NOTE — Telephone Encounter (Signed)
Transition Care Management Unsuccessful Follow-up Telephone Call  Date of discharge and from where:  02/26/21 from [facility]  Attempts:  2nd Attempt  Reason for unsuccessful TCM follow-up call:  Left voice message

## 2021-03-05 ENCOUNTER — Ambulatory Visit: Payer: BC Managed Care – PPO | Admitting: Neurology

## 2021-03-05 ENCOUNTER — Encounter: Payer: Self-pay | Admitting: Neurology

## 2021-03-05 VITALS — BP 126/86 | HR 74 | Ht 69.5 in | Wt 175.0 lb

## 2021-03-05 DIAGNOSIS — R569 Unspecified convulsions: Secondary | ICD-10-CM

## 2021-03-05 NOTE — Progress Notes (Signed)
[facility]  PATIENT: James Marshall DOB: 02-Jun-1980  REFERRING CLINICIAN: Mannie Stabile, PA* HISTORY FROM: Patient and spouse [name]  REASON FOR VISIT: Seizure    HISTORICAL  CHIEF COMPLAINT:  Chief Complaint  Patient presents with   New Patient (Initial Visit)    Room 12 with his wife, [name]. ED follow up for seizure-like activity. No medications started at hospital.    HISTORY OF PRESENT ILLNESS:  This is a 40 year old male with past medical history of depression, hypertension and alcohol use disorder who is presenting following a seizure at work.  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  He stated that he knows what he wanted to say but did the word was not coming out and next thing that he remembers is waking up with EMS and a lot of people around him.  He reported he was confused, agitated but was able to recognize his wife among the crowd.  By the time he got to the ED, he was back to his normal self.  He was witnessed to have a generalized tonic-clonic seizure, with foaming of the mouth and tongue biting, no urinary incontinence.  Entire episode lasted 2 minutes and 45 seconds.  In the ED he had a MRI brain with and without contrast and was normal.  Patient denies any previous history of seizures, states that younger brother had seizures at the age of 31, he did have 2 seizures but none since.  No other reported seizure risk factors. Patient also reported history of alcohol use, stated that he used to drink 1 pint of liquor every night sometimes more.  He has been doing this for a long time, he stated the night before, meaning October 26 he did not have any alcohol because he knew that he had a event the next day. No alcohol use since his seizure.     Handedness: right handed   Seizure Type:  Generalized convulsion  Current frequency: Once   Any injuries from seizures: None, tongue biting   Seizure risk factors: Brother with seizures when he was 5  Previous ASMs: None   Currenty ASMs: None   ASMs side effects: N/A   Brain Images: Normal Brain MRI   Previous EEGs: Not yet   OTHER MEDICAL CONDITIONS: Hypertension, hyperlipidemia, alcohol use  REVIEW OF SYSTEMS: Full 14 system review of systems performed and negative with exception of: As noted in the HPI.  ALLERGIES: Allergies  Allergen Reactions   Hydrocodone Nausea And Vomiting    HOME MEDICATIONS: Outpatient Medications Prior to Visit  Medication Sig Dispense Refill   escitalopram (LEXAPRO) 10 MG tablet TAKE 1 TABLET (10 MG TOTAL) BY MOUTH DAILY. NEEDS APPT FOR FURTHER REFILLS (Patient taking differently: Take 5 mg by mouth daily as needed (anxiety). NEEDS APPT FOR FURTHER REFILLS) 15 tablet 0   losartan (COZAAR) 50 MG tablet Take 1 tablet (50 mg total) by mouth daily. 90 tablet 1   No facility-administered medications prior to visit.    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   Normocytic anemia 7/17   Sepsis (HCC) 7/17    PAST SURGICAL HISTORY: Past  Surgical History:  Procedure Laterality Date   CYSTOSCOPY W/ URETERAL STENT PLACEMENT Right 11/07/2015   Procedure: CYSTOSCOPY WITH RETROGRADE AND URETERAL STENT PLACEMENT;  Surgeon: Bjorn Pippin, MD;  Location: [facility] OR;  Service: Urology;  Laterality: Right;   CYSTOSCOPY/RETROGRADE/URETEROSCOPY/STONE EXTRACTION WITH BASKET Right 11/19/2015   Procedure: CYSTOSCOPY WITH RIGHT URETEROSCOPY, STONE BASKETRY;  Surgeon: Bjorn Pippin, MD;  Location: [facility];  Service: Urology;  Laterality: Right;    FAMILY HISTORY: Family History  Problem Relation Age of Onset   Cervical cancer Mother    Diabetes Paternal  Grandfather    Prostate cancer Paternal Grandfather    Heart disease Maternal Grandfather     SOCIAL HISTORY: Social History   Socioeconomic History   Marital status: Married    Spouse name: Not on file   Number of children: 2   Years of education: college   Highest education level: Not on file  Occupational History   Occupation: Runner, 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  Tobacco Use   Smoking status: Former    Packs/day: 1.00    Years: 10.00    Pack years: 10.00    Types: Cigarettes   Smokeless tobacco: Former  Building services engineer Use: Never used  Substance and Sexual Activity   Alcohol use: Yes    Alcohol/week: 14.0 standard drinks    Types: 14 Standard drinks or equivalent per week    Comment: Pt states that he has at least 1 drink/day but has days where he does have heavy drinking and may drink a pint or more a day   Drug use: No   Sexual activity: Yes    Partners: Female    Birth control/protection: Implant  Other Topics Concern   Not on file  Social History Narrative   Lives at home with his wife.   Right-handed.   No daily use of caffeine.   Social Determinants of Health   Financial Resource Strain: Not on file  Food Insecurity: Not on file  Transportation Needs: Not on file  Physical Activity: Not on file  Stress: Not on file  Social Connections: Not on file  Intimate Partner Violence: Not on file     PHYSICAL EXAM  GENERAL EXAM/CONSTITUTIONAL: Vitals:  Vitals:   03/05/21 1000  BP: 126/86  Pulse: 74  Weight: 175 lb (79.4 kg)  Height: 5' 9.5" (1.765 m)   Body mass index is 25.47 kg/m. Wt Readings from Last 3 Encounters:  03/05/21 175 lb (79.4 kg)  01/02/20 178 lb (80.7 kg)  12/05/19 179 lb 3.2 oz (81.3 kg)   Patient is in no distress; well developed, nourished and groomed; neck is supple  EYES: Pupils round and reactive to light, Visual fields full to confrontation, Extraocular movements intacts,  No results found.  MUSCULOSKELETAL: Gait, strength, tone,  movements noted in Neurologic exam below  NEUROLOGIC: MENTAL STATUS:  No flowsheet data found. awake, alert, oriented to person, place and time recent and remote memory intact normal attention and concentration language fluent, comprehension intact, naming intact fund of knowledge appropriate  CRANIAL NERVE:  2nd, 3rd, 4th, 6th - pupils equal and reactive to light, visual fields full to confrontation, extraocular muscles intact, no nystagmus 5th - facial sensation symmetric 7th - facial strength symmetric 8th - hearing intact 9th - palate elevates symmetrically, uvula midline 11th - shoulder shrug symmetric 12th - tongue protrusion midline  MOTOR:  normal bulk and tone, full strength in the BUE, BLE  SENSORY:  normal and symmetric to light touch, pinprick,  temperature, vibration  COORDINATION:  finger-nose-finger, fine finger movements normal  REFLEXES:  deep tendon reflexes present and symmetric  GAIT/STATION:  normal    DIAGNOSTIC DATA (LABS, IMAGING, TESTING) - I reviewed patient records, labs, notes, testing and imaging myself where available.  Lab Results  Component Value Date   WBC 8.0 02/25/2021   HGB 15.0 02/25/2021   HCT 43.3 02/25/2021   MCV 92.5 02/25/2021   PLT 248 02/25/2021      Component Value Date/Time   NA 134 (L) 02/25/2021 2003   K 3.7 02/25/2021 2003   CL 99 02/25/2021 2003   CO2 24 02/25/2021 2003   GLUCOSE 107 (H) 02/25/2021 2003   BUN 10 02/25/2021 2003   CREATININE 0.79 02/25/2021 2003   CREATININE 0.77 12/05/2019 1050   CALCIUM 9.6 02/25/2021 2003   PROT 7.5 02/25/2021 2003   ALBUMIN 4.6 02/25/2021 2003   AST 46 (H) 02/25/2021 2003   ALT 35 02/25/2021 2003   ALKPHOS 52 02/25/2021 2003   BILITOT 1.1 02/25/2021 2003   GFRNONAA >60 02/25/2021 2003   GFRNONAA 114 12/05/2019 1050   GFRAA 132 12/05/2019 1050   No results found for: CHOL, HDL, LDLCALC, LDLDIRECT, TRIG No results found for: HGBA1C No results found for:  VITAMINB12 No results found for: TSH   MRI Brain with and without contrast  1. No evidence of acute intracranial abnormality. 2. Mild chronic microvascular ischemic disease.  I personally reviewed brain Images.   ASSESSMENT AND PLAN  40 y.o. year old male  with past medical history of hypertension, depression, alcohol use who is presenting after first lifetime witnessed seizure.  Patient denies any previous history of seizures and no seizures since his presentation to the ED.  His MRI brain is normal with no evidence of seizure focus and is pending a EEG.  He also reported a long history of alcohol use, he used to drink nightly, states that he did not have alcohol the night before his seizure when he used to drink nightly.  His seizures might be alcohol withdrawal seizures but nonetheless I will obtain the EEG and advised patient against driving for 69-month.  I also advised the patient regarding alcohol use.  He reported he has not had a drink since October 27 and I encouraged him to stop drinking.  He also understands that he cannot drive for 6 months.  I will contact the patient after the completion of his EEG otherwise I will see him in 6 months.  I also advised the patient and his wife Rogelia Mire to give me a call if he has another seizure.   1. Seizures (HCC)     PLAN: Continue current medications Routine EEG Alcohol abstinence Return in 6 months    Per [facility] statutes, patients with seizures are not allowed to drive until they have been seizure-free for six months.  Other recommendations include using caution when using heavy equipment or power tools. Avoid working on ladders or at heights. Take showers instead of baths.  Do not swim alone.  Ensure the water temperature is not too high on the home water heater. Do not go swimming alone. Do not lock yourself in a room alone (i.e. bathroom). When caring for infants or small children, sit down when holding, feeding, or changing  them to minimize risk of injury to the child in the event you have a seizure. Maintain good sleep hygiene. Avoid alcohol.  Also recommend adequate sleep, hydration, good diet and minimize stress.  During the Seizure  - First, ensure adequate ventilation and place patients on the floor on their left side  Loosen clothing around the neck and ensure the airway is patent. If the patient is clenching the teeth, do not force the mouth open with any object as this can cause severe damage - Remove all items from the surrounding that can be hazardous. The patient may be oblivious to what's happening and may not even know what he or she is doing. If the patient is confused and wandering, either gently guide him/her away and block access to outside areas - Reassure the individual and be comforting - Call 911. In most cases, the seizure ends before EMS arrives. However, there are cases when seizures may last over 3 to 5 minutes. Or the individual may have developed breathing difficulties or severe injuries. If a pregnant patient or a person with diabetes develops a seizure, it is prudent to call an ambulance. - Finally, if the patient does not regain full consciousness, then call EMS. Most patients will remain confused for about 45 to 90 minutes after a seizure, so you must use judgment in calling for help. - Avoid restraints but make sure the patient is in a bed with padded side rails - Place the individual in a lateral position with the neck slightly flexed; this will help the saliva drain from the mouth and prevent the tongue from falling backward - Remove all nearby furniture and other hazards from the area - Provide verbal assurance as the individual is regaining consciousness - Provide the patient with privacy if possible - Call for help and start treatment as ordered by the caregiver   After the Seizure (Postictal Stage)  After a seizure, most patients experience confusion, fatigue, muscle pain  and/or a headache. Thus, one should permit the individual to sleep. For the next few days, reassurance is essential. Being calm and helping reorient the person is also of importance.  Most seizures are painless and end spontaneously. Seizures are not harmful to others but can lead to complications such as stress on the lungs, brain and the heart. Individuals with prior lung problems may develop labored breathing and respiratory distress.     Orders Placed This Encounter  Procedures   EEG adult     No orders of the defined types were placed in this encounter.   Return in about 6 months (around 09/02/2021).    Windell Norfolk, MD 03/05/2021, 10:49 AM  [facility] [address] [place], [place] [address] [phone]

## 2021-03-05 NOTE — Telephone Encounter (Signed)
Transition Care Management Unsuccessful Follow-up Telephone Call  Date of discharge and from where:  02/26/21 from [name]  Attempts:  3rd Attempt  Reason for unsuccessful TCM follow-up call:  Left voice message

## 2021-03-05 NOTE — Patient Instructions (Addendum)
Continue current medications Routine EEG Alcohol abstinence Return in 6 months     Per [facility] statutes, patients with seizures are not allowed to drive until they have been seizure-free for six months.  Other recommendations include using caution when using heavy equipment or power tools. Avoid working on ladders or at heights. Take showers instead of baths.  Do not swim alone.  Ensure the water temperature is not too high on the home water heater. Do not go swimming alone. Do not lock yourself in a room alone (i.e. bathroom). When caring for infants or small children, sit down when holding, feeding, or changing them to minimize risk of injury to the child in the event you have a seizure. Maintain good sleep hygiene. Avoid alcohol.  Also recommend adequate sleep, hydration, good diet and minimize stress.   During the Seizure  - First, ensure adequate ventilation and place patients on the floor on their left side  Loosen clothing around the neck and ensure the airway is patent. If the patient is clenching the teeth, do not force the mouth open with any object as this can cause severe damage - Remove all items from the surrounding that can be hazardous. The patient may be oblivious to what's happening and may not even know what he or she is doing. If the patient is confused and wandering, either gently guide him/her away and block access to outside areas - Reassure the individual and be comforting - Call 911. In most cases, the seizure ends before EMS arrives. However, there are cases when seizures may last over 3 to 5 minutes. Or the individual may have developed breathing difficulties or severe injuries. If a pregnant patient or a person with diabetes develops a seizure, it is prudent to call an ambulance. - Finally, if the patient does not regain full consciousness, then call EMS. Most patients will remain confused for about 45 to 90 minutes after a seizure, so you must use judgment in  calling for help. - Avoid restraints but make sure the patient is in a bed with padded side rails - Place the individual in a lateral position with the neck slightly flexed; this will help the saliva drain from the mouth and prevent the tongue from falling backward - Remove all nearby furniture and other hazards from the area - Provide verbal assurance as the individual is regaining consciousness - Provide the patient with privacy if possible - Call for help and start treatment as ordered by the caregiver   After the Seizure (Postictal Stage)  After a seizure, most patients experience confusion, fatigue, muscle pain and/or a headache. Thus, one should permit the individual to sleep. For the next few days, reassurance is essential. Being calm and helping reorient the person is also of importance.  Most seizures are painless and end spontaneously. Seizures are not harmful to others but can lead to complications such as stress on the lungs, brain and the heart. Individuals with prior lung problems may develop labored breathing and respiratory distress.

## 2021-03-11 ENCOUNTER — Ambulatory Visit: Payer: BC Managed Care – PPO | Admitting: Neurology

## 2021-03-11 DIAGNOSIS — R569 Unspecified convulsions: Secondary | ICD-10-CM | POA: Diagnosis not present

## 2021-03-12 NOTE — Procedures (Signed)
    History:  44 yom with 1 lifetime seizure, in the setting of not drinking alcohol in more than 24hrs.   EEG classification: Awake and drowsy  Description of the recording: The background rhythms of this recording consists of a fairly well modulated medium amplitude alpha rhythm of 10-111 Hz that is reactive to eye opening and closure. As the record progresses, the patient appears to remain in the waking state throughout the recording. Photic stimulation was performed, did not show any abnormalities. Hyperventilation was also performed, did not show any abnormalities. Toward the end of the recording, the patient enters the drowsy state with slight symmetric slowing seen. The patient never enters stage II sleep. No abnormal epileptiform discharges seen during this recording. There was no focal slowing. EKG monitor shows no evidence of cardiac rhythm abnormalities with a heart rate of 72.  Impression: This is a normal EEG recording in the waking and drowsy state. No evidence of interictal epileptiform discharges seen. A normal EEG does not exclude a diagnosis of epilepsy.    Windell Norfolk, MD [facility]

## 2021-03-29 ENCOUNTER — Other Ambulatory Visit: Payer: Self-pay | Admitting: Medical-Surgical

## 2021-04-30 ENCOUNTER — Other Ambulatory Visit: Payer: Self-pay | Admitting: Medical-Surgical

## 2021-05-11 ENCOUNTER — Other Ambulatory Visit: Payer: Self-pay | Admitting: Medical-Surgical

## 2021-05-31 ENCOUNTER — Other Ambulatory Visit: Payer: Self-pay | Admitting: Medical-Surgical

## 2021-05-31 ENCOUNTER — Telehealth: Payer: Self-pay | Admitting: Medical-Surgical

## 2021-05-31 MED ORDER — LOSARTAN POTASSIUM 50 MG PO TABS: ORAL_TABLET | ORAL | 0 refills | Status: DC

## 2021-05-31 NOTE — Telephone Encounter (Signed)
30 day sent to pharmacy. Patient is aware he will need to keep upcoming appointment.

## 2021-05-31 NOTE — Telephone Encounter (Signed)
Patient called to see if he is able to get a refill of his BP med until his appointment on 3/2. Stated he only has a few left and will be out. Please advise.

## 2021-06-15 ENCOUNTER — Ambulatory Visit: Payer: BC Managed Care – PPO | Admitting: Neurology

## 2021-06-15 ENCOUNTER — Encounter: Payer: Self-pay | Admitting: Neurology

## 2021-06-15 VITALS — BP 137/96 | HR 90 | Ht 70.0 in | Wt 175.0 lb

## 2021-06-15 DIAGNOSIS — F10930 Alcohol use, unspecified with withdrawal, uncomplicated: Secondary | ICD-10-CM

## 2021-06-15 DIAGNOSIS — R569 Unspecified convulsions: Secondary | ICD-10-CM | POA: Diagnosis not present

## 2021-06-15 NOTE — Patient Instructions (Signed)
Continue current medications  Follow up with your doctor, at that time, can discuss about restarting Citalopram  Return as needed

## 2021-06-15 NOTE — Progress Notes (Signed)
[facility]  PATIENT: James Marshall DOB: 06/27/1980  REFERRING CLINICIAN: Samuel Bouche, NP HISTORY FROM: Patient and spouse James Marshall  REASON FOR VISIT: Seizure    HISTORICAL  CHIEF COMPLAINT:  Chief Complaint  Patient presents with   Follow-up    RM 11, alone. Has had a few "weird" days. Feels like he is having an "out of body experience." Denies seizures.    INTERVAL HISTORY 06/15/2021:  Patient presents today for follow-up, denies any additional seizures since last visit.  EEG was completed and was normal, no focal slowing and no epileptiform discharges.  He reports since the seizure he has been very anxious about having another seizure, sometimes at work he will have out of body experience and that this will just bring up anxiety.  His primary doctor started him on citalopram but he ran out of medication and did not call for refill.  Patient reports that while on the citalopram he did feel better, less anxiety.  He is set to see his primary care doctor in 2 weeks.     HISTORY OF PRESENT ILLNESS:  This is a 41 year old male with past medical history of depression, hypertension and alcohol use disorder who is presenting following a seizure at work.  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  He stated that he knows what he wanted to say but did the word was not coming out and next thing that he remembers is waking up with EMS and a lot of people around him.  He reported he was confused, agitated but was able to recognize his wife among the crowd.  By the time he got to the ED, he was back to his normal self.  He was witnessed to have a generalized tonic-clonic seizure, with foaming of the mouth and tongue biting, no urinary incontinence.  Entire episode lasted 2 minutes and 45 seconds.  In the ED he had a MRI brain with  and without contrast and was normal.  Patient denies any previous history of seizures, states that younger brother had seizures at the age of 75, he did have 2 seizures but none since.  No other reported seizure risk factors. Patient also reported history of alcohol use, stated that he used to drink 1 pint of liquor every night sometimes more.  He has been doing this for a long time, he stated the night before, meaning October 26 he did not have any alcohol because he knew that he had a event the next day. No alcohol use since his seizure.     Handedness: right handed   Seizure Type: Generalized convulsion  Current frequency: Once   Any injuries from seizures: None, tongue biting   Seizure risk factors: Brother with seizures when he was 5  Previous ASMs: None   Currenty ASMs: None   ASMs side effects: N/A   Brain Images: Normal Brain MRI   Previous EEGs: Not yet   OTHER MEDICAL CONDITIONS: Hypertension, hyperlipidemia, alcohol use  REVIEW OF SYSTEMS: Full 14 system review of systems performed and negative with exception of: As noted in the HPI.  ALLERGIES: Allergies  Allergen Reactions   Hydrocodone Nausea And Vomiting    HOME MEDICATIONS: Outpatient Medications Prior to Visit  Medication Sig Dispense Refill   losartan (COZAAR) 50 MG tablet TAKE 1 TABLET BY MOUTH  EVERY DAY * NEEDS APPT FOR FURTHER REFILLS* 30 tablet 0   escitalopram (LEXAPRO) 10 MG tablet TAKE 1 TABLET (10 MG TOTAL) BY MOUTH DAILY. NEEDS APPT FOR FURTHER REFILLS (Patient taking differently: Take 5 mg by mouth daily as needed (anxiety). NEEDS APPT FOR FURTHER REFILLS) 15 tablet 0   No facility-administered medications prior to visit.    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   Normocytic anemia 7/17   Sepsis ([place]) 7/17    PAST SURGICAL  HISTORY: Past Surgical History:  Procedure Laterality Date   CYSTOSCOPY W/ URETERAL STENT PLACEMENT Right 11/07/2015   Procedure: CYSTOSCOPY WITH RETROGRADE AND URETERAL STENT PLACEMENT;  Surgeon: Irine Seal, MD;  Location: [place];  Service: Urology;  Laterality: Right;   CYSTOSCOPY/RETROGRADE/URETEROSCOPY/STONE EXTRACTION WITH BASKET Right 11/19/2015   Procedure: CYSTOSCOPY WITH RIGHT URETEROSCOPY, STONE BASKETRY;  Surgeon: Irine Seal, MD;  Location: [facility];  Service: Urology;  Laterality: Right;    FAMILY HISTORY: Family History  Problem Relation Age of Onset   Cervical cancer Mother    Diabetes Paternal Grandfather    Prostate cancer Paternal Grandfather    Heart disease Maternal Grandfather     SOCIAL HISTORY: Social History   Socioeconomic History   Marital status: Married    Spouse name: Not on file   Number of children: 2   Years of education: college   Highest education level: Not on file  Occupational History   Occupation: Pharmacist, 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  Tobacco Use   Smoking status: Former    Packs/day: 1.00    Years: 10.00    Pack years: 10.00    Types: Cigarettes   Smokeless tobacco: Former  Scientific laboratory technician Use: Never used  Substance and Sexual Activity   Alcohol use: Yes    Alcohol/week: 14.0 standard drinks    Types: 14 Standard drinks or equivalent per week    Comment: Pt states that he has at least 1 drink/day but has days where he does have heavy drinking and may drink a pint or more a day   Drug use: No   Sexual activity: Yes    Partners: Female    Birth control/protection: Implant  Other Topics Concern   Not on file  Social History Narrative   Lives at home with his wife.   Right-handed.   No daily use of caffeine.   Social Determinants of Health   Financial Resource Strain: Not on file  Food Insecurity: Not on file  Transportation Needs: Not on file  Physical Activity: Not on file  Stress: Not on file  Social Connections: Not on file  Intimate  Partner Violence: Not on file     PHYSICAL EXAM  GENERAL EXAM/CONSTITUTIONAL: Vitals:  Vitals:   06/15/21 1108  BP: (!) 137/96  Pulse: 90  Weight: 175 lb (79.4 kg)  Height: 5\' 10"  (1.778 m)    Body mass index is 25.11 kg/m. Wt Readings from Last 3 Encounters:  06/15/21 175 lb (79.4 kg)  03/05/21 175 lb (79.4 kg)  01/02/20 178 lb (80.7 kg)   Patient is in no distress; well developed, nourished and groomed; neck is supple  EYES: Pupils round and reactive to light, Visual fields full to confrontation, Extraocular movements intacts,  No results found.  MUSCULOSKELETAL: Gait, strength, tone, movements noted in  Neurologic exam below  NEUROLOGIC: MENTAL STATUS:  No flowsheet data found. awake, alert, oriented to person, place and time recent and remote memory intact normal attention and concentration language fluent, comprehension intact, naming intact fund of knowledge appropriate  CRANIAL NERVE:  2nd, 3rd, 4th, 6th - pupils equal and reactive to light, visual fields full to confrontation, extraocular muscles intact, no nystagmus 5th - facial sensation symmetric 7th - facial strength symmetric 8th - hearing intact 9th - palate elevates symmetrically, uvula midline 11th - shoulder shrug symmetric 12th - tongue protrusion midline  MOTOR:  normal bulk and tone, full strength in the BUE, BLE  SENSORY:  normal and symmetric to light touch, pinprick, temperature, vibration  COORDINATION:  finger-nose-finger, fine finger movements normal  REFLEXES:  deep tendon reflexes present and symmetric  GAIT/STATION:  normal    DIAGNOSTIC DATA (LABS, IMAGING, TESTING) - I reviewed patient records, labs, notes, testing and imaging myself where available.  Lab Results  Component Value Date   WBC 8.0 02/25/2021   HGB 15.0 02/25/2021   HCT 43.3 02/25/2021   MCV 92.5 02/25/2021   PLT 248 02/25/2021      Component Value Date/Time   NA 134 (L) 02/25/2021 2003   K 3.7  02/25/2021 2003   CL 99 02/25/2021 2003   CO2 24 02/25/2021 2003   GLUCOSE 107 (H) 02/25/2021 2003   BUN 10 02/25/2021 2003   CREATININE 0.79 02/25/2021 2003   CREATININE 0.77 12/05/2019 1050   CALCIUM 9.6 02/25/2021 2003   PROT 7.5 02/25/2021 2003   ALBUMIN 4.6 02/25/2021 2003   AST 46 (H) 02/25/2021 2003   ALT 35 02/25/2021 2003   ALKPHOS 52 02/25/2021 2003   BILITOT 1.1 02/25/2021 2003   GFRNONAA >60 02/25/2021 2003   GFRNONAA 114 12/05/2019 1050   GFRAA 132 12/05/2019 1050   No results found for: CHOL, HDL, LDLCALC, LDLDIRECT, TRIG No results found for: HGBA1C No results found for: VITAMINB12 No results found for: TSH   MRI Brain with and without contrast  1. No evidence of acute intracranial abnormality. 2. Mild chronic microvascular ischemic disease.   Routine EEG 03/11/2021 This is a normal EEG recording in the waking and drowsy state. No evidence of interictal epileptiform discharges seen. A normal EEG does not exclude a diagnosis of epilepsy  I personally reviewed brain Images.   ASSESSMENT AND PLAN  41 y.o. year old male  with past medical history of hypertension, depression, alcohol use here for follow-up for his alcohol withdrawal seizure. He has not had any additional seizures since last visit.  He reports abstinence will alcohol, but still very anxious about having another seizure.  His primary care doctor started him on citalopram but he ran out of medication and did not call for follow-up. He is set to see her in 2 weeks at that time he will discuss about continuing the citalopram because he reports benefit while taking it.  Currently he is not driving, I informed him after 6 months of being seizure-free he can restart driving.  Also informed the patient that since his seizure was in the setting of alcohol withdrawal, and he has a normal EEG, I do not believe that he has epilepsy.  He just need to remain off alcohol, keep a good health, exercise, sleep well,  continue his medication and continue to follow-up with his primary care doctor. He can return to the office as needed.  He voices understanding and is comfortable with plans    1.  Alcohol withdrawal seizure without complication [facility])     Patient Instructions  Continue current medications  Follow up with your doctor, at that time, can discuss about restarting Citalopram  Return as needed     Per [facility] statutes, patients with seizures are not allowed to drive until they have been seizure-free for six months.  Other recommendations include using caution when using heavy equipment or power tools. Avoid working on ladders or at heights. Take showers instead of baths.  Do not swim alone.  Ensure the water temperature is not too high on the home water heater. Do not go swimming alone. Do not lock yourself in a room alone (i.e. bathroom). When caring for infants or small children, sit down when holding, feeding, or changing them to minimize risk of injury to the child in the event you have a seizure. Maintain good sleep hygiene. Avoid alcohol.  Also recommend adequate sleep, hydration, good diet and minimize stress.   During the Seizure  - First, ensure adequate ventilation and place patients on the floor on their left side  Loosen clothing around the neck and ensure the airway is patent. If the patient is clenching the teeth, do not force the mouth open with any object as this can cause severe damage - Remove all items from the surrounding that can be hazardous. The patient may be oblivious to what's happening and may not even know what he or she is doing. If the patient is confused and wandering, either gently guide him/her away and block access to outside areas - Reassure the individual and be comforting - Call 911. In most cases, the seizure ends before EMS arrives. However, there are cases when seizures may last over 3 to 5 minutes. Or the individual may have developed breathing  difficulties or severe injuries. If a pregnant patient or a person with diabetes develops a seizure, it is prudent to call an ambulance. - Finally, if the patient does not regain full consciousness, then call EMS. Most patients will remain confused for about 45 to 90 minutes after a seizure, so you must use judgment in calling for help. - Avoid restraints but make sure the patient is in a bed with padded side rails - Place the individual in a lateral position with the neck slightly flexed; this will help the saliva drain from the mouth and prevent the tongue from falling backward - Remove all nearby furniture and other hazards from the area - Provide verbal assurance as the individual is regaining consciousness - Provide the patient with privacy if possible - Call for help and start treatment as ordered by the caregiver   After the Seizure (Postictal Stage)  After a seizure, most patients experience confusion, fatigue, muscle pain and/or a headache. Thus, one should permit the individual to sleep. For the next few days, reassurance is essential. Being calm and helping reorient the person is also of importance.  Most seizures are painless and end spontaneously. Seizures are not harmful to others but can lead to complications such as stress on the lungs, brain and the heart. Individuals with prior lung problems may develop labored breathing and respiratory distress.     No orders of the defined types were placed in this encounter.    No orders of the defined types were placed in this encounter.    Return if symptoms worsen or fail to improve.    Alric Ran, MD 06/15/2021, 11:51 AM  [facility] [address]  [place], [place] [address] [phone]

## 2021-06-23 ENCOUNTER — Other Ambulatory Visit: Payer: Self-pay | Admitting: Medical-Surgical

## 2021-07-01 ENCOUNTER — Encounter: Payer: Self-pay | Admitting: Medical-Surgical

## 2021-07-01 ENCOUNTER — Other Ambulatory Visit: Payer: Self-pay

## 2021-07-01 ENCOUNTER — Ambulatory Visit: Payer: BC Managed Care – PPO | Admitting: Medical-Surgical

## 2021-07-01 VITALS — BP 136/86 | HR 80 | Temp 98.2°F | Ht 70.0 in | Wt 180.0 lb

## 2021-07-01 DIAGNOSIS — F1010 Alcohol abuse, uncomplicated: Secondary | ICD-10-CM | POA: Diagnosis not present

## 2021-07-01 DIAGNOSIS — F419 Anxiety disorder, unspecified: Secondary | ICD-10-CM | POA: Diagnosis not present

## 2021-07-01 DIAGNOSIS — I10 Essential (primary) hypertension: Secondary | ICD-10-CM

## 2021-07-01 MED ORDER — LOSARTAN POTASSIUM 100 MG PO TABS: 100.0000 mg | ORAL_TABLET | Freq: Every day | ORAL | 1 refills | Status: DC

## 2021-07-01 MED ORDER — BUPROPION HCL ER (XL) 150 MG PO TB24: 150.0000 mg | ORAL_TABLET | ORAL | 0 refills | Status: DC

## 2021-07-01 NOTE — Progress Notes (Signed)
?  HPI with pertinent ROS:  ? ?CC: HTN, ETOH abuse/mood follow up ? ?HPI: ?Pleasant 41 year old male presenting today for the following: ? ?Hypertension-checking blood pressures at home daily with readings consistently in the 130-140/80-90 range.  Taking losartan 50 mg daily, tolerating well without side effects.  Has been doing cardiovascular and strength training exercises most each week.  Eating a lower sodium diet and often does vegan meals. Denies CP, SOB, palpitations, lower extremity edema, dizziness, headaches, or vision changes. ? ?Alcohol abuse-unfortunately started drinking he had a withdrawal seizure.  He has been worked up by neurology and notes that there is nothing wrong with his brain seizure disorder.  He will drink periods of binge drinking.  Episode was on Saturday when he went to the [place] game with a friend and he notes he drank "quite a lot".  He understands that he needs to quit drinking and he is open to options to help with that journey.  Wonders if there is medication that will help with the cravings.  Notes that sometimes he does not even want the taste of the alcohol but because it is a habit, he goes ahead with drinking. ? ?Mood-overall is doing very well.  He and his wife are expecting their third child who is due in August.  He is not currently taking any medications for this but notes that he was on the Lexapro and did very well on it.  He does have quite a bit of anxiety about his health since his seizure episode.  Worries frequently that any symptom he feels may be the start of another seizure.  Denies SI/HI. ? ?I reviewed the past medical history, family history, social history, surgical history, and allergies today and no changes were needed.  Please see the problem list section below in epic for further details. ? ? ?Physical exam:  ? ?General: Well Developed, well nourished, and in no acute distress.  ?Neuro: Alert and oriented x3.  ?HEENT: Normocephalic, atraumatic.  ?Skin:  Warm and dry. ?Cardiac: Regular rate and rhythm, no murmurs rubs or gallops, no lower extremity edema.  ?Respiratory: Clear to auscultation bilaterally. Not using accessory muscles, speaking in full sentences. ? ?Impression and Recommendations:   ? ?1. Essential hypertension ?Recheck of blood pressure here 136/86 although his home readings are consistently a little higher.  Increasing losartan to 100 mg daily.  We may be able to decrease this as he reduces his alcohol intake but for goal of 130/80 or less.  Recommend continuing to lean keep a log.  Advised to bring his blood pressure cuff and the log with him to his nurse visit in 2 weeks so that we can verify accuracy and review his results. ? ?2. Severe anxiety ?Most of his anxiety seems to revolve around health issues at this point.  In his everyday life.  Starting Wellbutrin 150 mg daily for the next 1 to 2 weeks then increase to 300 mg daily if tolerating this well. ? ?3. Alcohol abuse ?He is actively seeking change and looking to quit drinking.  Starting Wellbutrin as above to help with cravings.  Recommend looking into AA. ? ?Return in about 2 weeks (around 07/15/2021) for nurse visit for BP check. ?___________________________________________ ?Thayer Ohm, DNP, APRN, FNP-BC ?Primary Care and Sports Medicine ?[facility] ?

## 2021-07-08 ENCOUNTER — Other Ambulatory Visit: Payer: Self-pay | Admitting: Medical-Surgical

## 2021-07-15 ENCOUNTER — Ambulatory Visit: Payer: BC Managed Care – PPO

## 2021-07-22 ENCOUNTER — Other Ambulatory Visit: Payer: Self-pay

## 2021-07-22 ENCOUNTER — Ambulatory Visit (INDEPENDENT_AMBULATORY_CARE_PROVIDER_SITE_OTHER): Payer: BC Managed Care – PPO | Admitting: Family Medicine

## 2021-07-22 VITALS — BP 150/98 | HR 79

## 2021-07-22 DIAGNOSIS — I10 Essential (primary) hypertension: Secondary | ICD-10-CM | POA: Diagnosis not present

## 2021-07-22 NOTE — Progress Notes (Signed)
? ?Established Patient Office Visit ? ?Subjective:  ?Patient ID: James Marshall, male    DOB: 1981/04/20  Age: 41 y.o. MRN: [id number] ? ?CC:  ?Chief Complaint  ?Patient presents with  ? Hypertension  ? ? ?HPI ?James Marshall presents for blood pressure check. Denies trouble sleeping, chest pain, palpitations or medication problems. ? ?Past Medical History:  ?Diagnosis Date  ? Acute pyelonephritis   ? Anxiety   ? Depression   ? GERD (gastroesophageal reflux disease)   ? Hypertension   ? 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 Normocytic anemia 7/17  ? Sepsis ([facility]) 7/17  ? ? ?Past Surgical History:  ?Procedure Laterality Date  ? CYSTOSCOPY W/ URETERAL STENT PLACEMENT Right 11/07/2015  ? Procedure: CYSTOSCOPY WITH RETROGRADE AND URETERAL STENT PLACEMENT;  Surgeon: Bjorn Pippin, MD;  Location: [facility];  Service: Urology;  Laterality: Right;  ? CYSTOSCOPY/RETROGRADE/URETEROSCOPY/STONE EXTRACTION WITH BASKET Right 11/19/2015  ? Procedure: CYSTOSCOPY WITH RIGHT URETEROSCOPY, STONE BASKETRY;  Surgeon: Bjorn Pippin, MD;  Location: [facility];  Service: Urology;  Laterality: Right;  ? ? ?Family History  ?Problem Relation Age of Onset  ? Cervical cancer Mother   ? Diabetes Paternal Grandfather   ? Prostate cancer Paternal Grandfather   ? Heart disease Maternal Grandfather   ? ? ?Social History  ? ?Socioeconomic History  ? Marital status: Married  ?  Spouse name: Not on file  ? Number of children: 2  ? Years of education: college  ? Highest education level: Not on file  ?Occupational History  ? 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 Smoking status: Former  ?  Packs/day: 1.00  ?  Years: 10.00  ?  Pack years: 10.00  ?  Types: Cigarettes  ? Smokeless tobacco: Former  ?Vaping Use  ? Vaping Use: Never used  ?Substance and Sexual Activity  ? Alcohol use: Yes  ?  Alcohol/week: 14.0 standard drinks  ?  Types: 14 Standard drinks or equivalent per week  ?  Comment: Pt states that he has at least 1 drink/day but has days where he does have heavy  drinking and may drink a pint or more a day  ? Drug use: No  ? Sexual activity: Yes  ?  Partners: Female  ?  Birth control/protection: Implant  ?Other Topics Concern  ? Not on file  ?Social History Narrative  ? Lives at home with his wife.  ? Right-handed.  ? No daily use of caffeine.  ? ?Social Determinants of Health  ? ?Financial Resource Strain: Not on file  ?Food Insecurity: Not on file  ?Transportation Needs: Not on file  ?Physical Activity: Not on file  ?Stress: Not on file  ?Social Connections: Not on file  ?Intimate Partner Violence: Not on file  ? ? ?Outpatient Medications Prior to Visit  ?Medication Sig Dispense Refill  ? buPROPion (WELLBUTRIN XL) 150 MG 24 hr tablet Take 1 tablet (150 mg total) by mouth every morning. Increase to 300mg  daily in 1-2 weeks if tolerating the medication well. 90 tablet 0  ? losartan (COZAAR) 100 MG tablet Take 1 tablet (100 mg total) by mouth daily. 90 tablet 1  ? ?No facility-administered medications prior to visit.  ? ? ?Allergies  ?Allergen Reactions  ? Hydrocodone Nausea And Vomiting  ? ? ?ROS ?Review of Systems ? ?  ?Objective:  ?  ?Physical Exam ? ?BP (!) 150/98   Pulse 79   SpO2 100%  ?Wt Readings from Last 3 Encounters:  ?07/01/21 180  lb (81.6 kg)  ?06/15/21 175 lb (79.4 kg)  ?03/05/21 175 lb (79.4 kg)  ? ? ? ?Health Maintenance Due  ?Topic Date Due  ? COVID-19 Vaccine (1) Never done  ? ? ?There are no preventive care reminders to display for this patient. ? ?No results found for: TSH ?Lab Results  ?Component Value Date  ? WBC 8.0 02/25/2021  ? HGB 15.0 02/25/2021  ? HCT 43.3 02/25/2021  ? MCV 92.5 02/25/2021  ? PLT 248 02/25/2021  ? ?Lab Results  ?Component Value Date  ? NA 134 (L) 02/25/2021  ? K 3.7 02/25/2021  ? CO2 24 02/25/2021  ? GLUCOSE 107 (H) 02/25/2021  ? BUN 10 02/25/2021  ? CREATININE 0.79 02/25/2021  ? BILITOT 1.1 02/25/2021  ? ALKPHOS 52 02/25/2021  ? AST 46 (H) 02/25/2021  ? ALT 35 02/25/2021  ? PROT 7.5 02/25/2021  ? ALBUMIN 4.6 02/25/2021  ?  CALCIUM 9.6 02/25/2021  ? ANIONGAP 11 02/25/2021  ? ?No results found for: CHOL ?No results found for: HDL ?No results found for: LDLCALC ?No results found for: TRIG ?No results found for: CHOLHDL ?No results found for: HGBA1C ? ?  ?Assessment & Plan:  ? ?Patient has been taking 100 mg losartan which was increased from 50 mg at last visit with his primary care provider. Blood pressure was elevated (155/89) at first check. Patient was allowed to sit for 5 minutes and blood pressure was still elevated at 150/98.  ? ?We compared readings with his home blood pressure machine. Readings were higher at 164/83. Patient states readings are in 130's at home.  ? ?Consulted covering provider. Current plan is to continue monitoring at home and follow up with primary care provider at next scheduled appointment. Pt is aware to bring home blood pressure cuff with him. ? ?Problem List Items Addressed This Visit   ?None ? ? ?No orders of the defined types were placed in this encounter. ? ? ?Follow-up: In 6 weeks with pcp (09/02/2021) ? ?Gaynelle Arabian, CMA ?

## 2021-07-22 NOTE — Progress Notes (Signed)
Medical screening examination/treatment was performed by qualified clinical staff member and as supervising physician I was immediately available for consultation/collaboration. I have reviewed documentation and agree with assessment and plan.  Danielys Madry, DO  

## 2021-09-02 ENCOUNTER — Ambulatory Visit: Payer: BC Managed Care – PPO | Admitting: Medical-Surgical

## 2021-09-02 ENCOUNTER — Ambulatory Visit: Payer: BC Managed Care – PPO | Admitting: Neurology

## 2021-09-15 ENCOUNTER — Encounter: Payer: Self-pay | Admitting: Neurology

## 2021-09-15 ENCOUNTER — Ambulatory Visit: Payer: BC Managed Care – PPO | Admitting: Neurology

## 2021-09-15 VITALS — BP 133/96 | HR 79 | Ht 70.0 in | Wt 177.5 lb

## 2021-09-15 DIAGNOSIS — R569 Unspecified convulsions: Secondary | ICD-10-CM

## 2021-09-15 DIAGNOSIS — F10930 Alcohol use, unspecified with withdrawal, uncomplicated: Secondary | ICD-10-CM

## 2021-09-15 NOTE — Patient Instructions (Signed)
Follow-up with your primary care doctor ?Return as needed ?

## 2021-09-15 NOTE — Progress Notes (Signed)
? ?[facility] ? ?PATIENT: James [facility] ?DOB: 02/14/1981 ? ?REFERRING CLINICIAN: [name], [name], NP ?HISTORY FROM: Patient and spouse Claiborne BillingsVanneisha  ?REASON FOR VISIT: Seizure  ? ? ?HISTORICAL ? ?CHIEF COMPLAINT:  ?Chief Complaint  ?Patient presents with  ? Follow-up  ?  Rm 12. Alone. ?Denies any new seizure activity.  ? ?INTERVAL HISTORY 09/15/2021:  ?Patient presents today for follow-up, Last visit was in February 14.  Since last visit, no seizures.  He did follow-up with his primary care doctor and was put back on Wellbutrin.  Currently no complaints.  Said that his sleep is better, currently getting 6-1/2 to 7 hours of sleep.  No other issues or complaints.  Doing well. ? ?INTERVAL HISTORY 06/15/2021:  ?Patient presents today for follow-up, denies any additional seizures since last visit.  EEG was completed and was normal, no focal slowing and no epileptiform discharges.  He reports since the seizure he has been very anxious about having another seizure, sometimes at work he will have out of body experience and that this will just bring up anxiety.  His primary doctor started him on citalopram but he ran out of medication and did not call for refill.  Patient reports that while on the citalopram he did feel better, less anxiety.  He is set to see his primary care doctor in 2 weeks.   ? ? ?HISTORY OF PRESENT ILLNESS:  ?This is a 41 year old male with past medical history of depression, hypertension and alcohol use disorder who is presenting following a seizure at work.  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  He stated that he knows what he wanted to say but did the word was not coming out and next thing that he remembers is waking up with EMS and a lot of people around him.  He reported he was confused, agitated but was able to recognize his wife  among the crowd.  By the time he got to the ED, he was back to his normal self.  He was witnessed to have a generalized tonic-clonic seizure, with foaming of the mouth and tongue biting, no urinary incontinence.  Entire episode lasted 2 minutes and 45 seconds.  In the ED he had a MRI brain with and without contrast and was normal.  Patient denies any previous history of seizures, states that younger brother had seizures at the age of 255, he did have 2 seizures but none since.  No other reported seizure risk factors. ?Patient also reported history of alcohol use, stated that he used to drink 1 pint of liquor every night sometimes more.  He has been doing this for a long time, he stated the night before, meaning October 26 he did not have any alcohol because he knew that he had a event the next day. No alcohol use since his seizure.  ? ? ? ?Handedness: right handed  ? ?Seizure Type: Generalized convulsion ? ?Current frequency: Once  ? ?Any injuries from seizures: None, tongue biting  ? ?Seizure risk factors: Brother with seizures when he was 5 ? ?Previous ASMs: None  ? ?Currenty ASMs: None  ? ?ASMs side effects: N/A  ? ?Brain Images: Normal Brain MRI  ? ?Previous EEGs: Not yet ? ? ?OTHER MEDICAL CONDITIONS: Hypertension, hyperlipidemia, alcohol use ? ?REVIEW OF SYSTEMS: Full 14 system review of  systems performed and negative with exception of: As noted in the HPI. ? ?ALLERGIES: ?Allergies  ?Allergen Reactions  ? Hydrocodone Nausea And Vomiting  ? ? ?HOME MEDICATIONS: ?Outpatient Medications Prior to Visit  ?Medication Sig Dispense Refill  ? buPROPion (WELLBUTRIN XL) 150 MG 24 hr tablet Take 1 tablet (150 mg total) by mouth every morning. Increase to 300mg  daily in 1-2 weeks if tolerating the medication well. 90 tablet 0  ? losartan (COZAAR) 100 MG tablet Take 1 tablet (100 mg total) by mouth daily. 90 tablet 1  ? ?No facility-administered medications prior to visit.  ? ? ?PAST MEDICAL HISTORY: ?Past Medical History:   ?Diagnosis Date  ? Acute pyelonephritis   ? Anxiety   ? Depression   ? GERD (gastroesophageal reflux disease)   ? Hypertension   ? 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 Normocytic anemia 7/17  ? Sepsis (HCC) 7/17  ? ? ?PAST SURGICAL HISTORY: ?Past Surgical History:  ?Procedure Laterality Date  ? CYSTOSCOPY W/ URETERAL STENT PLACEMENT Right 11/07/2015  ? Procedure: CYSTOSCOPY WITH RETROGRADE AND URETERAL STENT PLACEMENT;  Surgeon: 01/08/2016, MD;  Location: [facility] OR;  Service: Urology;  Laterality: Right;  ? CYSTOSCOPY/RETROGRADE/URETEROSCOPY/STONE EXTRACTION WITH BASKET Right 11/19/2015  ? Procedure: CYSTOSCOPY WITH RIGHT URETEROSCOPY, STONE BASKETRY;  Surgeon: 11/21/2015, MD;  Location: [facility] ORS;  Service: Urology;  Laterality: Right;  ? ? ?FAMILY HISTORY: ?Family History  ?Problem Relation Age of Onset  ? Cervical cancer Mother   ? Diabetes Paternal Grandfather   ? Prostate cancer Paternal Grandfather   ? Heart disease Maternal Grandfather   ? ? ?SOCIAL HISTORY: ?Social History  ? ?Socioeconomic History  ? Marital status: Married  ?  Spouse name: Not on file  ? Number of children: 2  ? Years of education: college  ? Highest education level: Not on file  ?Occupational History  ? 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 Smoking status: Former  ?  Packs/day: 1.00  ?  Years: 10.00  ?  Pack years: 10.00  ?  Types: Cigarettes  ? Smokeless tobacco: Former  ?Vaping Use  ? Vaping Use: Never used  ?Substance and Sexual Activity  ? Alcohol use: Yes  ?  Alcohol/week: 14.0 standard drinks  ?  Types: 14 Standard drinks or equivalent per week  ?  Comment: Pt states that he has at least 1 drink/day but has days where he does have heavy drinking and may drink a pint or more a day  ? Drug use: No  ? Sexual activity: Yes  ?  Partners: Female  ?  Birth control/protection: Implant  ?Other Topics Concern  ? Not on file  ?Social History Narrative  ? Lives at home with his wife.  ? Right-handed.  ? No daily use of caffeine.  ? ?Social  Determinants of Health  ? ?Financial Resource Strain: Not on file  ?Food Insecurity: Not on file  ?Transportation Needs: Not on file  ?Physical Activity: Not on file  ?Stress: Not on file  ?Social Connections: Not on file  ?Intimate Partner Violence: Not on file  ? ? ? ?PHYSICAL EXAM ? ?GENERAL EXAM/CONSTITUTIONAL: ?Vitals:  ?Vitals:  ? 09/15/21 0856 09/15/21 0858  ?BP: (!) 158/101 (!) 133/96  ?Pulse: 89 79  ?Weight: 177 lb 8 oz (80.5 kg)   ?Height: 5\' 10"  (1.778 m)   ? ? ?Body mass index is 25.47 kg/m?. ?Wt Readings from Last 3 Encounters:  ?09/15/21 177 lb 8 oz (80.5 kg)  ?07/01/21 180  lb (81.6 kg)  ?06/15/21 175 lb (79.4 kg)  ? ?Patient is in no distress; well developed, nourished and groomed; neck is supple ? ?EYES: ?Pupils round and reactive to light, Visual fields full to confrontation, Extraocular movements intacts,  ?No results found. ? ?MUSCULOSKELETAL: ?Gait, strength, tone, movements noted in Neurologic exam below ? ?NEUROLOGIC: ?MENTAL STATUS:  ?   ? View : No data to display.  ?  ?  ?  ? ?awake, alert, oriented to person, place and time ?recent and remote memory intact ?normal attention and concentration ?language fluent, comprehension intact, naming intact ?fund of knowledge appropriate ? ?CRANIAL NERVE:  ?2nd, 3rd, 4th, 6th - pupils equal and reactive to light, visual fields full to confrontation, extraocular muscles intact, no nystagmus ?5th - facial sensation symmetric ?7th - facial strength symmetric ?8th - hearing intact ?9th - palate elevates symmetrically, uvula midline ?11th - shoulder shrug symmetric ?12th - tongue protrusion midline ? ?MOTOR:  ?normal bulk and tone, full strength in the BUE, BLE ? ?SENSORY:  ?normal and symmetric to light touch, pinprick, temperature, vibration ? ?COORDINATION:  ?finger-nose-finger, fine finger movements normal ? ?REFLEXES:  ?deep tendon reflexes present and symmetric ? ?GAIT/STATION:  ?normal ? ? ? ?DIAGNOSTIC DATA (LABS, IMAGING, TESTING) ?- I reviewed  patient records, labs, notes, testing and imaging myself where available. ? ?Lab Results  ?Component Value Date  ? WBC 8.0 02/25/2021  ? HGB 15.0 02/25/2021  ? HCT 43.3 02/25/2021  ? MCV 92.5 02/25/2021  ? PLT 248 10

## 2021-09-21 ENCOUNTER — Other Ambulatory Visit: Payer: Self-pay | Admitting: Medical-Surgical

## 2021-09-28 NOTE — Progress Notes (Unsigned)
   Established Patient Office Visit  Subjective   Patient ID: James [place], male   DOB: 12-12-1980 Age: 41 y.o. MRN: [id number]   No chief complaint on file.   HPI Pleasant 41 year old male presenting today for follow-up on Wellbutrin.  ROS    Objective:    There were no vitals filed for this visit.   Physical Exam    No results found for this or any previous visit (from the past 24 hour(s)).   {Labs (Optional):23779}  The ASCVD Risk score (Arnett DK, et al., 2019) failed to calculate for the following reasons:   Cannot find a previous HDL lab   Cannot find a previous total cholesterol lab   Assessment & Plan:   No problem-specific Assessment & Plan notes found for this encounter.   No follow-ups on file.  ___________________________________________ Thayer Ohm, DNP, APRN, FNP-BC Primary Care and Sports Medicine [facility]

## 2021-09-29 ENCOUNTER — Ambulatory Visit (INDEPENDENT_AMBULATORY_CARE_PROVIDER_SITE_OTHER): Payer: Self-pay | Admitting: Medical-Surgical

## 2021-09-29 DIAGNOSIS — Z91199 Patient's noncompliance with other medical treatment and regimen due to unspecified reason: Secondary | ICD-10-CM

## 2021-09-29 DIAGNOSIS — F1010 Alcohol abuse, uncomplicated: Secondary | ICD-10-CM

## 2021-09-29 DIAGNOSIS — F419 Anxiety disorder, unspecified: Secondary | ICD-10-CM

## 2021-12-26 ENCOUNTER — Telehealth: Payer: Self-pay | Admitting: Medical-Surgical

## 2021-12-27 ENCOUNTER — Other Ambulatory Visit: Payer: Self-pay | Admitting: Medical-Surgical

## 2021-12-31 NOTE — Telephone Encounter (Signed)
Patient needs an appointment for further refills.  Last filled 07/01/2021  Last office visit 07/22/2021  No Show 09/29/2021  30 tablets sent with no refills.

## 2022-01-04 NOTE — Telephone Encounter (Signed)
Called pt to schedule an appt, left voicemail to call back to schedule an appt. Tvt

## 2022-01-27 ENCOUNTER — Other Ambulatory Visit: Payer: Self-pay | Admitting: Medical-Surgical

## 2022-01-27 NOTE — Telephone Encounter (Signed)
Patient needs an appointment for further refills.  Last office visit 07/01/2021  NO show 09/29/2021  Last filled 12/29/2021  Sent 30 tablets to pharmacy. NO refills.

## 2022-01-28 NOTE — Telephone Encounter (Signed)
Called patient and LVM to schedule appointment. James [place]

## 2022-02-10 ENCOUNTER — Other Ambulatory Visit: Payer: Self-pay | Admitting: Medical-Surgical

## 2022-05-30 ENCOUNTER — Emergency Department (HOSPITAL_COMMUNITY)
Admission: EM | Admit: 2022-05-30 | Discharge: 2022-05-30 | Disposition: A | Discharge status: Home / Self Care | Payer: BC Managed Care – PPO | Attending: Emergency Medicine | Admitting: Emergency Medicine

## 2022-05-30 ENCOUNTER — Other Ambulatory Visit: Payer: Self-pay

## 2022-05-30 DIAGNOSIS — R569 Unspecified convulsions: Secondary | ICD-10-CM | POA: Diagnosis not present

## 2022-05-30 DIAGNOSIS — Z79899 Other long term (current) drug therapy: Secondary | ICD-10-CM | POA: Diagnosis not present

## 2022-05-30 DIAGNOSIS — I10 Essential (primary) hypertension: Secondary | ICD-10-CM | POA: Insufficient documentation

## 2022-05-30 LAB — CBC WITH DIFFERENTIAL/PLATELET
Abs Immature Granulocytes: 0.02 10*3/uL (ref 0.00–0.07)
Basophils Absolute: 0 10*3/uL (ref 0.0–0.1)
Basophils Relative: 1 %
Eosinophils Absolute: 0.1 10*3/uL (ref 0.0–0.5)
Eosinophils Relative: 1 %
HCT: 41.7 % (ref 39.0–52.0)
Hemoglobin: 14.9 g/dL (ref 13.0–17.0)
Immature Granulocytes: 0 %
Lymphocytes Relative: 12 %
Lymphs Abs: 0.7 10*3/uL (ref 0.7–4.0)
MCH: 33 pg (ref 26.0–34.0)
MCHC: 35.7 g/dL (ref 30.0–36.0)
MCV: 92.3 fL (ref 80.0–100.0)
Monocytes Absolute: 0.3 10*3/uL (ref 0.1–1.0)
Monocytes Relative: 5 %
Neutro Abs: 4.9 10*3/uL (ref 1.7–7.7)
Neutrophils Relative %: 81 %
Platelets: 213 10*3/uL (ref 150–400)
RBC: 4.52 MIL/uL (ref 4.22–5.81)
RDW: 12.3 % (ref 11.5–15.5)
WBC: 6 10*3/uL (ref 4.0–10.5)
nRBC: 0 % (ref 0.0–0.2)

## 2022-05-30 LAB — COMPREHENSIVE METABOLIC PANEL
ALT: 59 U/L — ABNORMAL HIGH (ref 0–44)
AST: 67 U/L — ABNORMAL HIGH (ref 15–41)
Albumin: 4.7 g/dL (ref 3.5–5.0)
Alkaline Phosphatase: 63 U/L (ref 38–126)
Anion gap: 17 — ABNORMAL HIGH (ref 5–15)
BUN: 9 mg/dL (ref 6–20)
CO2: 19 mmol/L — ABNORMAL LOW (ref 22–32)
Calcium: 9.2 mg/dL (ref 8.9–10.3)
Chloride: 98 mmol/L (ref 98–111)
Creatinine, Ser: 0.93 mg/dL (ref 0.61–1.24)
GFR, Estimated: >60 mL/min (ref >60)
Glucose, Bld: 189 mg/dL — ABNORMAL HIGH (ref 70–99)
Potassium: 3.7 mmol/L (ref 3.5–5.1)
Sodium: 134 mmol/L — ABNORMAL LOW (ref 135–145)
Total Bilirubin: 1 mg/dL (ref 0.3–1.2)
Total Protein: 7.7 g/dL (ref 6.5–8.1)

## 2022-05-30 LAB — RAPID URINE DRUG SCREEN, HOSP PERFORMED
Amphetamines: NOT DETECTED
Barbiturates: NOT DETECTED
Benzodiazepines: NOT DETECTED
Cocaine: NOT DETECTED
Opiates: NOT DETECTED
Tetrahydrocannabinol: NOT DETECTED

## 2022-05-30 LAB — URINALYSIS, ROUTINE W REFLEX MICROSCOPIC
Bacteria, UA: NONE SEEN
Bilirubin Urine: NEGATIVE
Glucose, UA: NEGATIVE mg/dL
Ketones, ur: 5 mg/dL — AB
Leukocytes,Ua: NEGATIVE
Nitrite: NEGATIVE
Protein, ur: 100 mg/dL — AB
Specific Gravity, Urine: 1.017 (ref 1.005–1.030)
pH: 6 (ref 5.0–8.0)

## 2022-05-30 LAB — CBG MONITORING, ED: Glucose-Capillary: 214 mg/dL — ABNORMAL HIGH (ref 70–99)

## 2022-05-30 MED ORDER — ACETAMINOPHEN 500 MG PO TABS
1000.0000 mg | ORAL_TABLET | Freq: Once | ORAL | Status: AC
  Administered 2022-05-30: 1000 mg via ORAL
  Filled 2022-05-30: qty 2

## 2022-05-30 MED ORDER — LACTATED RINGERS IV BOLUS
1000.0000 mL | Freq: Once | INTRAVENOUS | Status: AC
  Administered 2022-05-30: 1000 mL via INTRAVENOUS

## 2022-05-30 MED ORDER — ONDANSETRON 4 MG PO TBDP
4.0000 mg | ORAL_TABLET | Freq: Once | ORAL | Status: AC
  Administered 2022-05-30: 4 mg via ORAL
  Filled 2022-05-30: qty 1

## 2022-05-30 NOTE — ED Provider Notes (Signed)
[facility] EMERGENCY DEPARTMENT AT [facility] Provider Note  CSN: [id number] Arrival date & time: 05/30/22 1012  Chief Complaint(s) Seizures  HPI Athol B Krabbenhoft is a 42 y.o. male with history of anxiety, depression, 1 previous seizure presenting to the emergency department with seizure.  Patient was at work, noted to have some possible posturing with the left side of his body stiffening up, followed by shaking.  He was helped to the ground by a bystander.  Lasted around 20 seconds.  Was confused per EMS, has improved en route.  Currently, the patient reports nausea.  He denies other symptoms such as fevers or chills, recent headaches, chest pain or difficulty breathing, abdominal pain, diarrhea, pain in his arms or legs,.  He does have a small wound on his tongue.  He denies any drug use.  He does report some alcohol use, did drink a lot over the weekend but not having any withdrawal symptoms such as sweating, anxiety.  Has never had a seizure related to alcohol withdrawal.   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 Authorizing Provider  buPROPion (WELLBUTRIN XL) 150 MG 24 hr tablet TAKE 1 TABLET (150 MG TOTAL) BY MOUTH DAILY. PT NEEDS AN APPOINTMENT 01/27/22   Christen Butter, NP  losartan (COZAAR) 100 MG tablet Take 1 tablet (100 mg total) by mouth daily. Pt needs an appointment 01/27/22   Christen Butter, NP                                                                                                                                     Past Surgical History Past Surgical History:  Procedure Laterality Date   CYSTOSCOPY W/ URETERAL STENT PLACEMENT Right 11/07/2015   Procedure: CYSTOSCOPY WITH RETROGRADE AND URETERAL STENT PLACEMENT;  Surgeon: Bjorn Pippin, MD;  Location: [facility] OR;  Service: Urology;  Laterality: Right;   CYSTOSCOPY/RETROGRADE/URETEROSCOPY/STONE EXTRACTION WITH BASKET Right 11/19/2015   Procedure: CYSTOSCOPY WITH RIGHT URETEROSCOPY, STONE BASKETRY;  Surgeon: Bjorn Pippin, MD;  Location: [facility] ORS;  Service: Urology;  Laterality: Right;   Family History Family History  Problem Relation Age of Onset   Cervical cancer Mother    Diabetes Paternal Grandfather    Prostate cancer Paternal Grandfather    Heart disease Maternal Grandfather     Social History Social History   Tobacco Use  Smoking status: Former    Packs/day: 1.00    Years: 10.00    Total pack years: 10.00    Types: Cigarettes   Smokeless tobacco: Former  Scientific laboratory technician Use: Never used  Substance Use Topics   Alcohol use: Yes    Alcohol/week: 14.0 standard drinks of alcohol    Types: 14 Standard drinks or equivalent per week    Comment: Pt states that he has at least 1 drink/day but has days where he does have heavy drinking and may drink a pint or more a day   Drug use: No   Allergies Hydrocodone  Review of Systems Review of Systems  All other systems reviewed and are negative.   Physical Exam Vital Signs  I have reviewed the triage vital signs BP (!) 149/107 (BP Location: Right Arm)   Pulse 97   Temp 98.1 F (36.7 C) (Oral)   Resp 15   Ht 5\' 10"  (1.778 m)   Wt 81.6 kg   SpO2 93%   BMI 25.83 kg/m  Physical Exam Vitals and nursing note reviewed.  Constitutional:      General: He is not in acute distress.    Appearance: Normal appearance.  HENT:     Head: Normocephalic and atraumatic.     Mouth/Throat:     Mouth: Mucous membranes are moist.      Comments: Tiny laceration on the right side of the tongue Eyes:     Conjunctiva/sclera: Conjunctivae normal.  Cardiovascular:     Rate and Rhythm: Normal rate and regular rhythm.  Pulmonary:     Effort: Pulmonary effort is normal. No respiratory distress.     Breath sounds: Normal breath sounds.  Abdominal:     General: Abdomen is flat.     Palpations: Abdomen is soft.     Tenderness: There is no abdominal tenderness.  Musculoskeletal:     Right lower leg: No edema.     Left lower leg: No edema.  Skin:    General: Skin is warm and dry.     Capillary Refill: Capillary refill takes less than 2 seconds.  Neurological:     Mental Status: He is alert and oriented to person, place, and time. Mental status is at baseline.     Comments: Cranial nerves II through XII intact, strength 5 out of 5 in the bilateral upper and lower extremities, no sensory deficit to light touch, no dysmetria on finger-nose-finger testing  Psychiatric:        Mood and Affect: Mood normal.        Behavior: Behavior normal.     ED Results and Treatments Labs (all labs ordered are listed, but only abnormal results are displayed) Labs Reviewed  COMPREHENSIVE METABOLIC PANEL - Abnormal; Notable for the following components:      Result Value   Sodium 134 (*)    CO2 19 (*)    Glucose, Bld 189 (*)    AST 67 (*)    ALT 59 (*)    Anion gap 17 (*)    All other components within normal limits  URINALYSIS, ROUTINE W REFLEX MICROSCOPIC - Abnormal; Notable for the following components:   Hgb urine dipstick MODERATE (*)    Ketones, ur 5 (*)    Protein, ur 100 (*)    All other components within normal limits  CBG MONITORING, ED - Abnormal; Notable for the following components:   Glucose-Capillary 214 (*)    All other components within normal limits  CBC WITH DIFFERENTIAL/PLATELET  RAPID URINE DRUG SCREEN, HOSP PERFORMED                                                                                                                           Radiology No results found.  Pertinent labs & imaging results that were available during my care of the patient were reviewed by me and considered in my medical decision making (see MDM for details).  Medications Ordered in ED Medications  ondansetron (ZOFRAN-ODT) disintegrating tablet 4 mg (4 mg Oral Given 05/30/22 1037)  lactated ringers bolus 1,000 mL (1,000 mLs Intravenous New Bag/Given 05/30/22 1052)  acetaminophen (TYLENOL) tablet 1,000 mg (1,000 mg Oral Given 05/30/22 1128)                                                                                                                                     Procedures Procedures  (including critical care time)  Medical Decision Making / ED Course   MDM:  42 year old male presenting with seizure.  Patient well-appearing, neurologic exam now normal.  Patient at baseline mental status.  Only reports mild nausea, vitals notable for mild tachycardia.  Patient has previously had imaging for seizure around 1 year ago including MRI which is unremarkable.  He denies any new recent symptoms such as headaches nausea or vomiting to suggest new intracranial process.  He had a previous EEG which was unremarkable.  No fevers or chills, neck pain or stiffness to suggest meningitis or encephalitis.  He does endorse alcohol use but does not clinically have signs of alcohol withdrawal and denies any alcohol drawl symptoms.  He is on bupropion which may be related to his seizure.  Will obtain laboratory testing to evaluate for metabolic abnormality.  Will discuss with neurology, likely will need to be started on Keppra  Clinical Course as of 05/30/22 1317  Mon May 30, 2022  1313 Discussed with Dr. Lorrin Goodell, agrees with deferring repeat imaging given previous MRI and normal exam, return to baseline. Recommends d/c buproprion, would not start AED at this time given buproprion as inciting event. Advised f/u with PMD to discuss  other antidepressant medication. Advised f/u with neurologist. Discussed alcohol cessation and no driving for 6 months seizure free, avoiding swimming. Will discharge patient to home. All questions answered. Patient comfortable with plan of discharge. Return precautions discussed with patient and specified on the after  visit summary.  [WS]    Clinical Course User Index [WS] Lonell Grandchild, MD     Additional history obtained: -Additional history obtained from ems and spouse -External records from outside source obtained and reviewed including: Chart review including previous notes, labs, imaging, consultation notes including previous neurology note, previous MRI   Lab Tests: -I ordered, reviewed, and interpreted labs.   The pertinent results include:   Labs Reviewed  COMPREHENSIVE METABOLIC PANEL - Abnormal; Notable for the following components:      Result Value   Sodium 134 (*)    CO2 19 (*)    Glucose, Bld 189 (*)    AST 67 (*)    ALT 59 (*)    Anion gap 17 (*)    All other components within normal limits  URINALYSIS, ROUTINE W REFLEX MICROSCOPIC - Abnormal; Notable for the following components:   Hgb urine dipstick MODERATE (*)    Ketones, ur 5 (*)    Protein, ur 100 (*)    All other components within normal limits  CBG MONITORING, ED - Abnormal; Notable for the following components:   Glucose-Capillary 214 (*)    All other components within normal limits  CBC WITH DIFFERENTIAL/PLATELET  RAPID URINE DRUG SCREEN, HOSP PERFORMED    Notable for mild AST/ALT elevation likely due to recent alcohol use, mild low CO2 likely due to elevated lactate  EKG   EKG Interpretation  Date/Time:  Monday May 30 2022 10:30:33 EST Ventricular Rate:  103 PR Interval:  141 QRS Duration: 90 QT Interval:  364 QTC Calculation: 477 R Axis:   55 Text Interpretation: Sinus tachycardia Borderline prolonged QT interval Confirmed by Alvino Blood ([id number]) on 05/30/2022 10:47:15 AM         Medicines ordered and prescription drug management: Meds ordered this encounter  Medications   ondansetron (ZOFRAN-ODT) disintegrating tablet 4 mg   lactated ringers bolus 1,000 mL   acetaminophen (TYLENOL) tablet 1,000 mg    -I have reviewed the patients home medicines and have made adjustments as needed   Consultations Obtained: I requested consultation with the neurologist,  and discussed lab and imaging findings as well as pertinent plan - they recommend: d/c buproprion   Social Determinants of Health:  Diagnosis or treatment significantly limited by social determinants of health: alcohol use   Reevaluation: After the interventions noted above, I reevaluated the patient and found that they have improved  Co morbidities that complicate the patient evaluation  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    Final Clinical Impression(s) / ED Diagnoses Final diagnoses:  Seizure [facility])     This chart was dictated using voice recognition software.  Despite best efforts to proofread,  errors can occur which can change the documentation meaning.    Lonell Grandchild, MD 05/30/22 1318

## 2022-05-30 NOTE — Discharge Instructions (Addendum)
We evaluated you for your seizure.  Your laboratory tests were reassuring.  We discussed your case with neurology.  They recommend stopping your bupropion.  This medication can cause seizures in some people.  The neurologist does not think you need to start a seizure medicine at this time as long as you discontinue bupropion.  If you have another seizure while off bupropion, you may need to start treatment to prevent further seizures.  Please avoid alcohol as this may also trigger seizures.  Please do not drive or go swimming until you have been seizure-free for 6 months.  If you have a further seizure, please return to the emergency department.  Please also return if you develop any new symptoms such as severe headaches, vomiting, vision changes, numbness or tingling, or any other new concerning symptoms.

## 2022-05-30 NOTE — ED Triage Notes (Signed)
Pt bib [facility] from work where he had a witnessed seizure and was postictal afterwards. Pt arrives AOx4 with a small laceration on his tongue from biting it. EMS VSS but CBG 231

## 2022-05-31 ENCOUNTER — Telehealth: Payer: Self-pay | Admitting: Medical-Surgical

## 2022-05-31 MED ORDER — LOSARTAN POTASSIUM 100 MG PO TABS: 100.0000 mg | ORAL_TABLET | Freq: Every day | ORAL | 0 refills | Status: DC

## 2022-05-31 NOTE — Telephone Encounter (Signed)
Patient has an appointment on 06/06/2022  Sent 30 day supply of Losartan to [facility] on [address]

## 2022-05-31 NOTE — Telephone Encounter (Signed)
Pt called this morning to schedule an appt for seizures. He mentioned that he is out of BP medication. Los[facility]in. [facility] [address] [facility].

## 2022-06-02 ENCOUNTER — Telehealth: Payer: Self-pay | Admitting: General Practice

## 2022-06-02 NOTE — Telephone Encounter (Signed)
Transition Care Management Unsuccessful Follow-up Telephone Call  Date of discharge and from where:  05/30/22 from [facility]  Attempts:  1st Attempt  Reason for unsuccessful TCM follow-up call:  No answer/busy

## 2022-06-03 ENCOUNTER — Telehealth: Payer: Self-pay | Admitting: Pharmacist

## 2022-06-03 NOTE — Progress Notes (Signed)
Patient appearing on report for [employer] - Hypertension Control report due to last documented ambulatory blood pressure of 154/99 on 05/30/22. Next appointment with PCP is 06/06/22   Outreached patient to discuss hypertension control and medication management. Left voicemail for patient to return my call at their convenience.   Larinda Buttery, PharmD Clinical Pharmacist [facility] [phone]

## 2022-06-03 NOTE — Telephone Encounter (Signed)
Transition Care Management Unsuccessful Follow-up Telephone Call  Date of discharge and from where:  05/30/22 from [facility]  Attempts:  2nd Attempt  Reason for unsuccessful TCM follow-up call:  Left voice message

## 2022-06-06 ENCOUNTER — Encounter: Payer: Self-pay | Admitting: Medical-Surgical

## 2022-06-06 ENCOUNTER — Ambulatory Visit (INDEPENDENT_AMBULATORY_CARE_PROVIDER_SITE_OTHER): Payer: BC Managed Care – PPO | Admitting: Medical-Surgical

## 2022-06-06 VITALS — BP 137/82 | HR 80 | Resp 20 | Ht 70.0 in | Wt 179.9 lb

## 2022-06-06 DIAGNOSIS — Z09 Encounter for follow-up examination after completed treatment for conditions other than malignant neoplasm: Secondary | ICD-10-CM

## 2022-06-06 DIAGNOSIS — I10 Essential (primary) hypertension: Secondary | ICD-10-CM | POA: Diagnosis not present

## 2022-06-06 DIAGNOSIS — R7309 Other abnormal glucose: Secondary | ICD-10-CM

## 2022-06-06 DIAGNOSIS — Z23 Encounter for immunization: Secondary | ICD-10-CM | POA: Diagnosis not present

## 2022-06-06 DIAGNOSIS — R569 Unspecified convulsions: Secondary | ICD-10-CM | POA: Diagnosis not present

## 2022-06-06 DIAGNOSIS — F419 Anxiety disorder, unspecified: Secondary | ICD-10-CM

## 2022-06-06 MED ORDER — CHLORDIAZEPOXIDE HCL 25 MG PO CAPS: ORAL_CAPSULE | ORAL | 0 refills | Status: AC

## 2022-06-06 MED ORDER — ESCITALOPRAM OXALATE 10 MG PO TABS: 10.0000 mg | ORAL_TABLET | Freq: Every day | ORAL | 1 refills | Status: DC

## 2022-06-06 MED ORDER — LOSARTAN POTASSIUM 100 MG PO TABS: 100.0000 mg | ORAL_TABLET | Freq: Every day | ORAL | 3 refills | Status: DC

## 2022-06-06 NOTE — Progress Notes (Signed)
Established Patient Office Visit  Subjective   Patient ID: James Marshall, male   DOB: Feb 05, 1981 Age: 42 y.o. MRN: [id number]   Chief Complaint  Patient presents with   Follow-up   Seizures    HPI Pleasant 42 year old male accompanied by his wife presenting today for hospital discharge follow-up.  Notes that he had a witnessed seizure while at work on 05/30/2022.  He was unconscious for about 15 seconds however his recovery took several hours.  He was sent to the hospital via EMS where they did a full workup.  He has been followed with neurology and reports that the neurologist did not think his seizures were related to epilepsy.  Instead, he was told that they believed his seizures were related to alcohol use.  Today, he admits to continuing to use alcohol excessively although he has improved from what he was using a couple of years ago.  Reports that if he gets 1/5 of liquor on Saturday, most of the time is gone by Tuesday.  Something he drinks heavier than others.  Does not drink while at work or during important functions but admits that his first thought when he leaves work every day is to go home and get a drink to unwind.  Of note, we have discussed this in past visits and it was felt that his drinking was directly related to depression and anxiety issues.  He previously tried Lexapro which was helpful for his symptoms however this did not last.  Had tried Wellbutrin most recently however this was felt to possibly contribute to seizure activity so it was discontinued.  Not currently taking any medications for mood management.  Not currently doing any counseling.  No regular intentional exercise although he has downloaded an exercise program.  He has dumbbells and a stationary bike at home that he plans on starting to use regularly.  On a side note, his glucose was significantly elevated when he went to the hospital.  Not sure if this was in relation to the seizure or not.  Reports a family  history of diabetes.   Objective:    Vitals:   06/06/22 1125 06/06/22 1128  BP: (!) 144/84 137/82  Pulse: 91 80  Resp: 20 20  Height: 5\' 10"  (1.778 m)   Weight: 179 lb 14.4 oz (81.6 kg)   SpO2: 99% 99%  BMI (Calculated): 25.81     Physical Exam Vitals reviewed.  Constitutional:      General: He is not in acute distress.    Appearance: Normal appearance. He is not ill-appearing.  HENT:     Head: Normocephalic and atraumatic.  Cardiovascular:     Rate and Rhythm: Normal rate and regular rhythm.     Pulses: Normal pulses.     Heart sounds: Normal heart sounds. No murmur heard.    No friction rub. No gallop.  Pulmonary:     Effort: Pulmonary effort is normal. No respiratory distress.     Breath sounds: Normal breath sounds.  Skin:    General: Skin is warm and dry.  Neurological:     Mental Status: He is alert and oriented to person, place, and time.  Psychiatric:        Mood and Affect: Mood normal.        Behavior: Behavior normal.        Thought Content: Thought content normal.        Judgment: Judgment normal.   No results found for this or any previous  visit (from the past 24 hour(s)).     The ASCVD Risk score (Arnett DK, et al., 2019) failed to calculate for the following reasons:   Cannot find a previous HDL lab   Cannot find a previous total cholesterol lab   Assessment & Plan:   1. Hospital discharge follow-up 2. Seizure [facility]) Reviewed available records and discussed care with the patient.  He has a neurologist on board so would recommend following up with neurology.  May benefit from the addition of [place] for seizure management.  Ultimately, feel that his seizures are also related to alcohol abuse.  3. Elevated glucose Checking hemoglobin A1c. - Hemoglobin A1c  4.  Alcohol abuse Strongly recommend close cessation.  Adding Librium taper over the next 4 days to help with withdrawal symptoms.  Discussed using alternative measures for stress management.   Discussed making alcohol cessation and to a habit.  May benefit from returning to exercise regularly in place of "winding down" with alcohol in the afternoons.  Reviewed outpatient options such as outpatient rehab, AA, and inpatient detoxing.  5.  Severe anxiety Restarting Lexapro 5 mg daily for 1 week then increase to 10 mg daily thereafter.  6.  Essential hypertension Blood pressure at goal.  Continue losartan 100 mg daily.  7.  Need for influenza vaccination Flu vaccine given in office today. - Flu Vaccine QUAD 6+ mos PF IM (Fluarix Quad PF)   Return in about 6 weeks (around 07/18/2022) for ETOH cessation/mood follow up.  ___________________________________________ Clearnce Sorrel, DNP, APRN, FNP-BC Primary Care and [place]

## 2022-06-06 NOTE — Telephone Encounter (Signed)
Transition Care Management Follow-up Telephone Call Date of discharge and from where: 05/30/22 from [facility]  How have you been since you were released from the hospital? Patient had an OV with pcp today. Any questions or concerns? No

## 2022-07-18 ENCOUNTER — Ambulatory Visit: Payer: BC Managed Care – PPO | Admitting: Medical-Surgical

## 2022-12-04 ENCOUNTER — Other Ambulatory Visit: Payer: Self-pay | Admitting: Medical-Surgical

## 2022-12-06 NOTE — Telephone Encounter (Signed)
Attempted to contact patient to assist in scheduling - left a voice mail message requesting a return call.

## 2023-03-09 ENCOUNTER — Other Ambulatory Visit: Payer: Self-pay | Admitting: Medical-Surgical

## 2023-04-14 ENCOUNTER — Other Ambulatory Visit: Payer: Self-pay | Admitting: Medical-Surgical

## 2023-05-02 ENCOUNTER — Other Ambulatory Visit: Payer: Self-pay | Admitting: Medical-Surgical

## 2023-06-17 ENCOUNTER — Other Ambulatory Visit: Payer: Self-pay | Admitting: Medical-Surgical

## 2023-07-19 ENCOUNTER — Other Ambulatory Visit: Payer: Self-pay | Admitting: Medical-Surgical

## 2023-07-30 ENCOUNTER — Other Ambulatory Visit: Payer: Self-pay | Admitting: Medical-Surgical

## 2023-10-03 ENCOUNTER — Other Ambulatory Visit: Payer: Self-pay

## 2023-10-03 ENCOUNTER — Other Ambulatory Visit: Payer: Self-pay | Admitting: Medical-Surgical

## 2023-10-03 MED ORDER — LOSARTAN POTASSIUM 100 MG PO TABS: 100.0000 mg | ORAL_TABLET | Freq: Every day | ORAL | 0 refills | Status: DC

## 2023-10-03 NOTE — Telephone Encounter (Signed)
 Medication refilled. Pls contact pt to schedule appt for HTN with [facility]. Thx.  Copied from CRM [id number]. 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

## 2023-10-18 NOTE — Telephone Encounter (Unsigned)
 Copied from CRM [id number]. Topic: Clinical - Medication Refill >> Oct 18, 2023  1:46 PM Retta Caster wrote: Medication: losartan  (COZAAR ) 100 MG tablet   Has the patient contacted their pharmacy? Yes (Agent: If no, request that the patient contact the pharmacy for the refill. If patient does not wish to contact the pharmacy document the reason why and proceed with request.) (Agent: If yes, when and what did the pharmacy advise?)  This is the patient's preferred pharmacy:  [facility] [phone] Jonette Nestle, [place] - [address] [place] [address] Phone: [phone] Fax: [phone]  Is this the correct pharmacy for this prescription? Yes If no, delete pharmacy and type the correct one.   Has the prescription been filled recently? Yes  Is the patient out of the medication? No has 3 pills left ned refill due to set app for follow up on 11/09/23  Has the patient been seen for an appointment in the last year OR does the patient have an upcoming appointment? Yes  Can we respond through MyChart? Yes  Agent: Please be advised that Rx refills may take up to 3 business days. We ask that you follow-up with your pharmacy.

## 2023-10-24 ENCOUNTER — Other Ambulatory Visit: Payer: Self-pay | Admitting: Medical-Surgical

## 2023-11-09 ENCOUNTER — Encounter: Payer: Self-pay | Admitting: Medical-Surgical

## 2023-11-09 ENCOUNTER — Ambulatory Visit (INDEPENDENT_AMBULATORY_CARE_PROVIDER_SITE_OTHER): Admitting: Medical-Surgical

## 2023-11-09 VITALS — BP 149/85 | HR 89 | Ht 70.0 in | Wt 176.5 lb

## 2023-11-09 DIAGNOSIS — I10 Essential (primary) hypertension: Secondary | ICD-10-CM | POA: Diagnosis not present

## 2023-11-09 MED ORDER — VALSARTAN 160 MG PO TABS: 160.0000 mg | ORAL_TABLET | Freq: Every day | ORAL | 3 refills | Status: AC

## 2023-11-09 NOTE — Progress Notes (Signed)
        Established patient visit  Discussed the use of AI scribe software for clinical note transcription with the patient, who gave verbal consent to proceed.  History of Present Illness   James Marshall is a 43 year old male who presents with joint discomfort at night.  Nocturnal arthralgia - Mild, intermittent joint discomfort in shoulders and knees - Symptoms primarily occur at night - Discomfort disrupts sleep - Duration of symptoms is approximately six weeks  Hypertension - Managed with losartan  100 mg daily - Home blood pressure readings typically in the 130s mmHg - Elevated blood pressure readings after work, likely related to stress - Family history of hypertension  Constitutional and cardiopulmonary symptoms - No chest pain - No shortness of breath - No dizziness - No vision changes - No lower extremity swelling - No palpitations      Physical Exam Vitals reviewed.  Constitutional:      General: He is not in acute distress.    Appearance: Normal appearance. He is not ill-appearing.  HENT:     Head: Normocephalic and atraumatic.  Cardiovascular:     Rate and Rhythm: Normal rate and regular rhythm.     Pulses: Normal pulses.     Heart sounds: Normal heart sounds. No murmur heard.    No friction rub. No gallop.  Pulmonary:     Effort: Pulmonary effort is normal. No respiratory distress.     Breath sounds: Normal breath sounds.  Skin:    General: Skin is warm and dry.  Neurological:     Mental Status: He is alert and oriented to person, place, and time.  Psychiatric:        Mood and Affect: Mood normal.        Behavior: Behavior normal.        Thought Content: Thought content normal.        Judgment: Judgment normal.   Assessment and Plan    Joint Pain Intermittent nocturnal joint pain in shoulders and knees. Differential includes degenerative changes versus rheumatologic disorders, less likely due to absence of hand, finger, and foot involvement.  Discussed NSAIDs and prescription options. X-rays and inflammatory marker tests offered but deferred. - Recommend over-the-counter NSAIDs such as ibuprofen  or naproxen  as needed. - Discussed prescription meloxicam for as-needed use, declined today. - Monitor symptoms and return if he worsens.  Hypertension Home blood pressure readings improved to 130s from previous 170s. Currently on maximum dose of losartan  100 mg. Discussed switching to valsartan . - Discontinue losartan  and initiate valsartan  160 mg. - Schedule follow-up in two weeks for blood pressure check. - Advise regular home blood pressure monitoring, goal <130/80.  General Health Maintenance Emphasized regular exercise, stress management, and dietary modifications for overall health and blood pressure control. Alcohol intake reduced to weekends. - Encourage regular exercise, including cardio and strength training. - Advise monitoring sodium intake and managing stress. - Limit alcohol consumption to recommended guidelines.     Return in about 2 weeks (around 11/23/2023) for nurse visit for BP check.  __________________________________ James FREDRIK Palin, DNP, APRN, FNP-BC Primary Care and Sports Medicine [facility] [place]

## 2023-11-10 ENCOUNTER — Ambulatory Visit: Payer: Self-pay | Admitting: Medical-Surgical

## 2023-11-10 DIAGNOSIS — R748 Abnormal levels of other serum enzymes: Secondary | ICD-10-CM

## 2023-11-10 LAB — CBC WITH DIFFERENTIAL/PLATELET
Basophils Absolute: 0 x10E3/uL (ref 0.0–0.2)
Basos: 1 %
EOS (ABSOLUTE): 0.1 x10E3/uL (ref 0.0–0.4)
Eos: 2 %
Hematocrit: 40 % (ref 37.5–51.0)
Hemoglobin: 14.1 g/dL (ref 13.0–17.7)
Immature Grans (Abs): 0 x10E3/uL (ref 0.0–0.1)
Immature Granulocytes: 0 %
Lymphocytes Absolute: 1.6 x10E3/uL (ref 0.7–3.1)
Lymphs: 28 %
MCH: 34.3 pg — ABNORMAL HIGH (ref 26.6–33.0)
MCHC: 35.3 g/dL (ref 31.5–35.7)
MCV: 97 fL (ref 79–97)
Monocytes Absolute: 0.9 x10E3/uL (ref 0.1–0.9)
Monocytes: 15 %
Neutrophils Absolute: 3.1 x10E3/uL (ref 1.4–7.0)
Neutrophils: 54 %
Platelets: 199 x10E3/uL (ref 150–450)
RBC: 4.11 x10E6/uL — ABNORMAL LOW (ref 4.14–5.80)
RDW: 13.6 % (ref 11.6–15.4)
WBC: 5.6 x10E3/uL (ref 3.4–10.8)

## 2023-11-10 LAB — LIPID PANEL
Chol/HDL Ratio: 2.4 ratio (ref 0.0–5.0)
Cholesterol, Total: 218 mg/dL — ABNORMAL HIGH (ref 100–199)
HDL: 91 mg/dL (ref >39)
LDL Chol Calc (NIH): 118 mg/dL — ABNORMAL HIGH (ref 0–99)
Triglycerides: 53 mg/dL (ref 0–149)
VLDL Cholesterol Cal: 9 mg/dL (ref 5–40)

## 2023-11-10 LAB — CMP14+EGFR
ALT: 167 IU/L — ABNORMAL HIGH (ref 0–44)
AST: 193 IU/L — ABNORMAL HIGH (ref 0–40)
Albumin: 5 g/dL (ref 4.1–5.1)
Alkaline Phosphatase: 77 IU/L (ref 44–121)
BUN/Creatinine Ratio: 15 (ref 9–20)
BUN: 11 mg/dL (ref 6–24)
Bilirubin Total: 1.3 mg/dL — ABNORMAL HIGH (ref 0.0–1.2)
CO2: 23 mmol/L (ref 20–29)
Calcium: 9.3 mg/dL (ref 8.7–10.2)
Chloride: 96 mmol/L (ref 96–106)
Creatinine, Ser: 0.74 mg/dL — ABNORMAL LOW (ref 0.76–1.27)
Globulin, Total: 2.4 g/dL (ref 1.5–4.5)
Glucose: 85 mg/dL (ref 70–99)
Potassium: 3.4 mmol/L — ABNORMAL LOW (ref 3.5–5.2)
Sodium: 137 mmol/L (ref 134–144)
Total Protein: 7.4 g/dL (ref 6.0–8.5)
eGFR: 115 mL/min/1.73 (ref >59)

## 2023-11-21 LAB — FERRITIN: Ferritin: 239 ng/mL (ref 30–400)

## 2023-11-21 LAB — TSH: TSH: 2.4 u[IU]/mL (ref 0.450–4.500)

## 2023-11-21 LAB — HEPATITIS C ANTIBODY: Hep C Virus Ab: NONREACTIVE

## 2023-11-21 LAB — HEPATITIS B SURFACE ANTIGEN: Hepatitis B Surface Ag: NEGATIVE

## 2023-11-21 LAB — GAMMA GT: GGT: 94 IU/L — ABNORMAL HIGH (ref 0–65)

## 2023-11-23 ENCOUNTER — Ambulatory Visit

## 2023-12-08 ENCOUNTER — Ambulatory Visit

## 2023-12-08 DIAGNOSIS — R748 Abnormal levels of other serum enzymes: Secondary | ICD-10-CM
